# Patient Record
Sex: Female | Born: 1960 | Race: White | Hispanic: No | Marital: Married | Smoking: Never smoker
Health system: Southern US, Community
[De-identification: ages and names within clinical notes are randomized; demographics above are authoritative.]

## PROBLEM LIST (undated history)

## (undated) DIAGNOSIS — E7800 Pure hypercholesterolemia, unspecified: Secondary | ICD-10-CM

## (undated) DIAGNOSIS — H269 Unspecified cataract: Secondary | ICD-10-CM

## (undated) DIAGNOSIS — D219 Benign neoplasm of connective and other soft tissue, unspecified: Secondary | ICD-10-CM

## (undated) DIAGNOSIS — I10 Essential (primary) hypertension: Secondary | ICD-10-CM

## (undated) DIAGNOSIS — T7840XA Allergy, unspecified, initial encounter: Secondary | ICD-10-CM

## (undated) DIAGNOSIS — M1990 Unspecified osteoarthritis, unspecified site: Secondary | ICD-10-CM

## (undated) HISTORY — DX: Essential (primary) hypertension: I10

## (undated) HISTORY — DX: Unspecified osteoarthritis, unspecified site: M19.90

## (undated) HISTORY — PX: BREAST BIOPSY: SHX20

## (undated) HISTORY — DX: Allergy, unspecified, initial encounter: T78.40XA

## (undated) HISTORY — DX: Pure hypercholesterolemia, unspecified: E78.00

## (undated) HISTORY — PX: EYE SURGERY: SHX253

## (undated) HISTORY — DX: Benign neoplasm of connective and other soft tissue, unspecified: D21.9

## (undated) HISTORY — DX: Unspecified cataract: H26.9

## (undated) HISTORY — PX: ENDOMETRIAL ABLATION: SHX621

## (undated) HISTORY — PX: WISDOM TOOTH EXTRACTION: SHX21

## (undated) HISTORY — PX: BREAST EXCISIONAL BIOPSY: SUR124

---

## 2008-09-06 ENCOUNTER — Ambulatory Visit (HOSPITAL_COMMUNITY): Admission: RE | Admit: 2008-09-06 | Discharge: 2008-09-06 | Payer: Self-pay | Admitting: Obstetrics and Gynecology

## 2008-09-06 ENCOUNTER — Encounter (INDEPENDENT_AMBULATORY_CARE_PROVIDER_SITE_OTHER): Payer: Self-pay | Admitting: Obstetrics and Gynecology

## 2008-12-02 ENCOUNTER — Ambulatory Visit (HOSPITAL_COMMUNITY): Admission: RE | Admit: 2008-12-02 | Discharge: 2008-12-02 | Payer: Self-pay | Admitting: Obstetrics and Gynecology

## 2009-12-05 ENCOUNTER — Encounter: Admission: RE | Admit: 2009-12-05 | Discharge: 2009-12-05 | Payer: Self-pay | Admitting: Obstetrics and Gynecology

## 2010-08-15 LAB — CBC
HCT: 37.4 % (ref 36.0–46.0)
Hemoglobin: 12.9 g/dL (ref 12.0–15.0)
MCHC: 34.6 g/dL (ref 30.0–36.0)
MCV: 88.4 fL (ref 78.0–100.0)
Platelets: 255 10*3/uL (ref 150–400)
RDW: 13.9 % (ref 11.5–15.5)
WBC: 5.8 10*3/uL (ref 4.0–10.5)

## 2010-08-15 LAB — HCG, SERUM, QUALITATIVE: Preg, Serum: NEGATIVE

## 2010-09-19 NOTE — Op Note (Signed)
Catherine Yates, Catherine Yates               ACCOUNT NO.:  [id number]   MEDICAL RECORD NO.:  [id number]          PATIENT TYPE:  AMB   LOCATION:  SDC                           FACILITY:  [facility]   PHYSICIAN:  Lenoard Aden, M.D.DATE OF BIRTH:  1961-02-14   DATE OF PROCEDURE:  09/06/2008  DATE OF DISCHARGE:                               OPERATIVE REPORT   PREOPERATIVE DIAGNOSIS:  Menometrorrhagia with submucous fibroid.   POSTOPERATIVE DIAGNOSIS:  Menometrorrhagia with submucous fibroid.   PROCEDURE:  Diagnostic hysteroscopy,dilation and curettage,  resectoscopic myomectomy, NovaSure endometrial ablation.   SURGEON:  Lenoard Aden, MD   ANESTHESIA:  General and local.   ESTIMATED BLOOD LOSS:  Less than 50 mL.   COMPLICATIONS:  None.   DRAINS:  None.   COUNTS:  Correct.   FLUID DEFICIT:  45 mL.   The patient to Recovery in good condition.   BRIEF OPERATIVE NOTE:  After being apprised of the risks of anesthesia,  infection; bleeding; injury to abdominal organs; need for repair;  delayed versus immediate complications to include bowel and bladder  injury, possible inability to cure bleeding.  The patient brought to the  operating where she was administered general anesthetic without  complications, prepped and draped in the usual sterile fashion.  Feet  was placed on Yellofin stirrups, catheterized and the bladder was empty.  After achieving adequate anesthesia, dilute Pitressin solution was  placed at 3 and 9 o'clock at the cervicovaginal junction, 16 mL total.  No intravascular extravasation was noted.  At this time, cervix was  easily dilated up to #31 [facility] dilator.  Hysteroscope placed.  Visualization reveals free definitive submucous fibroids, 2 anteriorly  and 1 large one in the posterior fundal area.  All were resected using  multiple passes, used the right-angle loop down to the level of the  myometrium with good hemostasis and no evidence of uterine perforation.  Afterwards a large, but otherwise normal endometrial cavity was noted.  D&C and using sharp curettage in a four-quadrant method was performed  and good hemostasis was noted.  NovaSure device was placed.  Cavity  width of 4.5 was noted.  Device was placed, seated in the appropriate  fashion.  CO2 test performed was negative, and the procedure was  initiated for a total time of 50 seconds with good ablation of  endometrial cavity.  Revisualized once hysteroscopic placement.  No  evidence of uterine perforation.  Good  hemostasis noted.  All instruments removed.  The patient tolerated the  procedure well and dilute Marcaine solution was placed, 30 mL total with  a dilute paracervical block for postoperative pain relief.  The patient  was sent to the recovery in good condition.      Lenoard Aden, M.D.  Electronically Signed     RJT/MEDQ  D:  09/06/2008  T:  09/06/2008  Job:  [id number]

## 2010-12-04 ENCOUNTER — Other Ambulatory Visit: Payer: Self-pay | Admitting: Obstetrics and Gynecology

## 2010-12-04 DIAGNOSIS — Z1231 Encounter for screening mammogram for malignant neoplasm of breast: Secondary | ICD-10-CM

## 2010-12-12 ENCOUNTER — Ambulatory Visit
Admission: RE | Admit: 2010-12-12 | Discharge: 2010-12-12 | Disposition: A | Discharge status: Home / Self Care | Payer: BC Managed Care – PPO | Source: Ambulatory Visit | Attending: Obstetrics and Gynecology | Admitting: Obstetrics and Gynecology

## 2010-12-12 DIAGNOSIS — Z1231 Encounter for screening mammogram for malignant neoplasm of breast: Secondary | ICD-10-CM

## 2011-11-14 ENCOUNTER — Other Ambulatory Visit: Payer: Self-pay | Admitting: Obstetrics and Gynecology

## 2011-11-14 DIAGNOSIS — Z1231 Encounter for screening mammogram for malignant neoplasm of breast: Secondary | ICD-10-CM

## 2011-12-13 ENCOUNTER — Ambulatory Visit
Admission: RE | Admit: 2011-12-13 | Discharge: 2011-12-13 | Disposition: A | Discharge status: Home / Self Care | Payer: BC Managed Care – PPO | Source: Ambulatory Visit | Attending: Obstetrics and Gynecology | Admitting: Obstetrics and Gynecology

## 2011-12-13 DIAGNOSIS — Z1231 Encounter for screening mammogram for malignant neoplasm of breast: Secondary | ICD-10-CM

## 2012-02-06 ENCOUNTER — Other Ambulatory Visit: Payer: Self-pay | Admitting: Obstetrics and Gynecology

## 2012-02-06 DIAGNOSIS — N951 Menopausal and female climacteric states: Secondary | ICD-10-CM

## 2012-02-06 DIAGNOSIS — Z780 Asymptomatic menopausal state: Secondary | ICD-10-CM

## 2012-02-18 ENCOUNTER — Ambulatory Visit
Admission: RE | Admit: 2012-02-18 | Discharge: 2012-02-18 | Disposition: A | Discharge status: Home / Self Care | Payer: BC Managed Care – PPO | Source: Ambulatory Visit | Attending: Obstetrics and Gynecology | Admitting: Obstetrics and Gynecology

## 2012-02-18 DIAGNOSIS — Z780 Asymptomatic menopausal state: Secondary | ICD-10-CM

## 2012-02-18 DIAGNOSIS — N951 Menopausal and female climacteric states: Secondary | ICD-10-CM

## 2012-12-11 ENCOUNTER — Other Ambulatory Visit: Payer: Self-pay

## 2012-12-11 DIAGNOSIS — Z1231 Encounter for screening mammogram for malignant neoplasm of breast: Secondary | ICD-10-CM

## 2012-12-31 ENCOUNTER — Ambulatory Visit
Admission: RE | Admit: 2012-12-31 | Discharge: 2012-12-31 | Disposition: A | Discharge status: Home / Self Care | Payer: BC Managed Care – PPO | Source: Ambulatory Visit

## 2012-12-31 DIAGNOSIS — Z1231 Encounter for screening mammogram for malignant neoplasm of breast: Secondary | ICD-10-CM

## 2013-12-09 ENCOUNTER — Other Ambulatory Visit: Payer: Self-pay

## 2013-12-09 DIAGNOSIS — Z1231 Encounter for screening mammogram for malignant neoplasm of breast: Secondary | ICD-10-CM

## 2014-01-01 ENCOUNTER — Encounter (INDEPENDENT_AMBULATORY_CARE_PROVIDER_SITE_OTHER): Payer: Self-pay

## 2014-01-01 ENCOUNTER — Ambulatory Visit
Admission: RE | Admit: 2014-01-01 | Discharge: 2014-01-01 | Disposition: A | Discharge status: Home / Self Care | Payer: BC Managed Care – PPO | Source: Ambulatory Visit

## 2014-01-01 DIAGNOSIS — Z1231 Encounter for screening mammogram for malignant neoplasm of breast: Secondary | ICD-10-CM

## 2014-12-14 ENCOUNTER — Other Ambulatory Visit: Payer: Self-pay

## 2014-12-14 DIAGNOSIS — Z1231 Encounter for screening mammogram for malignant neoplasm of breast: Secondary | ICD-10-CM

## 2015-01-21 ENCOUNTER — Ambulatory Visit
Admission: RE | Admit: 2015-01-21 | Discharge: 2015-01-21 | Disposition: A | Discharge status: Home / Self Care | Payer: BLUE CROSS/BLUE SHIELD | Source: Ambulatory Visit

## 2015-01-21 DIAGNOSIS — Z1231 Encounter for screening mammogram for malignant neoplasm of breast: Secondary | ICD-10-CM

## 2015-12-21 ENCOUNTER — Other Ambulatory Visit: Payer: Self-pay | Admitting: Obstetrics and Gynecology

## 2015-12-21 DIAGNOSIS — Z1231 Encounter for screening mammogram for malignant neoplasm of breast: Secondary | ICD-10-CM

## 2016-01-23 ENCOUNTER — Ambulatory Visit
Admission: RE | Admit: 2016-01-23 | Discharge: 2016-01-23 | Disposition: A | Discharge status: Home / Self Care | Payer: BLUE CROSS/BLUE SHIELD | Source: Ambulatory Visit | Attending: Obstetrics and Gynecology | Admitting: Obstetrics and Gynecology

## 2016-01-23 DIAGNOSIS — Z1231 Encounter for screening mammogram for malignant neoplasm of breast: Secondary | ICD-10-CM

## 2016-05-07 HISTORY — PX: COLONOSCOPY: SHX174

## 2016-05-21 DIAGNOSIS — K648 Other hemorrhoids: Secondary | ICD-10-CM | POA: Diagnosis not present

## 2016-05-21 DIAGNOSIS — K5730 Diverticulosis of large intestine without perforation or abscess without bleeding: Secondary | ICD-10-CM | POA: Diagnosis not present

## 2016-05-21 DIAGNOSIS — Z86010 Personal history of colonic polyps: Secondary | ICD-10-CM | POA: Diagnosis not present

## 2016-06-15 DIAGNOSIS — M791 Myalgia: Secondary | ICD-10-CM | POA: Diagnosis not present

## 2016-06-15 DIAGNOSIS — M7611 Psoas tendinitis, right hip: Secondary | ICD-10-CM | POA: Diagnosis not present

## 2016-06-15 DIAGNOSIS — M9902 Segmental and somatic dysfunction of thoracic region: Secondary | ICD-10-CM | POA: Diagnosis not present

## 2016-06-15 DIAGNOSIS — M7701 Medial epicondylitis, right elbow: Secondary | ICD-10-CM | POA: Diagnosis not present

## 2016-06-20 DIAGNOSIS — M7701 Medial epicondylitis, right elbow: Secondary | ICD-10-CM | POA: Diagnosis not present

## 2016-06-20 DIAGNOSIS — M791 Myalgia: Secondary | ICD-10-CM | POA: Diagnosis not present

## 2016-06-20 DIAGNOSIS — M7611 Psoas tendinitis, right hip: Secondary | ICD-10-CM | POA: Diagnosis not present

## 2016-06-20 DIAGNOSIS — M9902 Segmental and somatic dysfunction of thoracic region: Secondary | ICD-10-CM | POA: Diagnosis not present

## 2016-06-22 DIAGNOSIS — M7611 Psoas tendinitis, right hip: Secondary | ICD-10-CM | POA: Diagnosis not present

## 2016-06-22 DIAGNOSIS — M7701 Medial epicondylitis, right elbow: Secondary | ICD-10-CM | POA: Diagnosis not present

## 2016-06-22 DIAGNOSIS — M791 Myalgia: Secondary | ICD-10-CM | POA: Diagnosis not present

## 2016-06-22 DIAGNOSIS — M9902 Segmental and somatic dysfunction of thoracic region: Secondary | ICD-10-CM | POA: Diagnosis not present

## 2016-06-27 DIAGNOSIS — M9902 Segmental and somatic dysfunction of thoracic region: Secondary | ICD-10-CM | POA: Diagnosis not present

## 2016-06-27 DIAGNOSIS — M791 Myalgia: Secondary | ICD-10-CM | POA: Diagnosis not present

## 2016-06-27 DIAGNOSIS — M7701 Medial epicondylitis, right elbow: Secondary | ICD-10-CM | POA: Diagnosis not present

## 2016-06-27 DIAGNOSIS — M7611 Psoas tendinitis, right hip: Secondary | ICD-10-CM | POA: Diagnosis not present

## 2016-07-04 DIAGNOSIS — M9902 Segmental and somatic dysfunction of thoracic region: Secondary | ICD-10-CM | POA: Diagnosis not present

## 2016-07-04 DIAGNOSIS — M791 Myalgia: Secondary | ICD-10-CM | POA: Diagnosis not present

## 2016-07-04 DIAGNOSIS — M7611 Psoas tendinitis, right hip: Secondary | ICD-10-CM | POA: Diagnosis not present

## 2016-07-04 DIAGNOSIS — M7701 Medial epicondylitis, right elbow: Secondary | ICD-10-CM | POA: Diagnosis not present

## 2016-07-18 DIAGNOSIS — M7701 Medial epicondylitis, right elbow: Secondary | ICD-10-CM | POA: Diagnosis not present

## 2016-07-18 DIAGNOSIS — M791 Myalgia: Secondary | ICD-10-CM | POA: Diagnosis not present

## 2016-07-18 DIAGNOSIS — M7611 Psoas tendinitis, right hip: Secondary | ICD-10-CM | POA: Diagnosis not present

## 2016-07-18 DIAGNOSIS — M9902 Segmental and somatic dysfunction of thoracic region: Secondary | ICD-10-CM | POA: Diagnosis not present

## 2016-07-25 DIAGNOSIS — M7611 Psoas tendinitis, right hip: Secondary | ICD-10-CM | POA: Diagnosis not present

## 2016-07-25 DIAGNOSIS — M9902 Segmental and somatic dysfunction of thoracic region: Secondary | ICD-10-CM | POA: Diagnosis not present

## 2016-07-25 DIAGNOSIS — M7701 Medial epicondylitis, right elbow: Secondary | ICD-10-CM | POA: Diagnosis not present

## 2016-07-25 DIAGNOSIS — M791 Myalgia: Secondary | ICD-10-CM | POA: Diagnosis not present

## 2016-08-08 DIAGNOSIS — M9902 Segmental and somatic dysfunction of thoracic region: Secondary | ICD-10-CM | POA: Diagnosis not present

## 2016-08-08 DIAGNOSIS — M7701 Medial epicondylitis, right elbow: Secondary | ICD-10-CM | POA: Diagnosis not present

## 2016-08-08 DIAGNOSIS — M791 Myalgia: Secondary | ICD-10-CM | POA: Diagnosis not present

## 2016-08-08 DIAGNOSIS — M7611 Psoas tendinitis, right hip: Secondary | ICD-10-CM | POA: Diagnosis not present

## 2016-08-29 DIAGNOSIS — M7701 Medial epicondylitis, right elbow: Secondary | ICD-10-CM | POA: Diagnosis not present

## 2016-08-29 DIAGNOSIS — M7611 Psoas tendinitis, right hip: Secondary | ICD-10-CM | POA: Diagnosis not present

## 2016-08-29 DIAGNOSIS — M9902 Segmental and somatic dysfunction of thoracic region: Secondary | ICD-10-CM | POA: Diagnosis not present

## 2016-08-29 DIAGNOSIS — M791 Myalgia: Secondary | ICD-10-CM | POA: Diagnosis not present

## 2016-09-12 DIAGNOSIS — M7611 Psoas tendinitis, right hip: Secondary | ICD-10-CM | POA: Diagnosis not present

## 2016-09-12 DIAGNOSIS — M791 Myalgia: Secondary | ICD-10-CM | POA: Diagnosis not present

## 2016-09-12 DIAGNOSIS — M7701 Medial epicondylitis, right elbow: Secondary | ICD-10-CM | POA: Diagnosis not present

## 2016-09-12 DIAGNOSIS — M9902 Segmental and somatic dysfunction of thoracic region: Secondary | ICD-10-CM | POA: Diagnosis not present

## 2016-10-03 DIAGNOSIS — M7701 Medial epicondylitis, right elbow: Secondary | ICD-10-CM | POA: Diagnosis not present

## 2016-10-03 DIAGNOSIS — M7611 Psoas tendinitis, right hip: Secondary | ICD-10-CM | POA: Diagnosis not present

## 2016-10-03 DIAGNOSIS — M791 Myalgia: Secondary | ICD-10-CM | POA: Diagnosis not present

## 2016-10-03 DIAGNOSIS — M9902 Segmental and somatic dysfunction of thoracic region: Secondary | ICD-10-CM | POA: Diagnosis not present

## 2016-10-12 DIAGNOSIS — M7611 Psoas tendinitis, right hip: Secondary | ICD-10-CM | POA: Diagnosis not present

## 2016-10-12 DIAGNOSIS — M7701 Medial epicondylitis, right elbow: Secondary | ICD-10-CM | POA: Diagnosis not present

## 2016-10-12 DIAGNOSIS — M791 Myalgia: Secondary | ICD-10-CM | POA: Diagnosis not present

## 2016-10-12 DIAGNOSIS — M9902 Segmental and somatic dysfunction of thoracic region: Secondary | ICD-10-CM | POA: Diagnosis not present

## 2016-10-19 DIAGNOSIS — S61213A Laceration without foreign body of left middle finger without damage to nail, initial encounter: Secondary | ICD-10-CM | POA: Diagnosis not present

## 2016-10-24 DIAGNOSIS — M9902 Segmental and somatic dysfunction of thoracic region: Secondary | ICD-10-CM | POA: Diagnosis not present

## 2016-10-24 DIAGNOSIS — M7701 Medial epicondylitis, right elbow: Secondary | ICD-10-CM | POA: Diagnosis not present

## 2016-10-24 DIAGNOSIS — M791 Myalgia: Secondary | ICD-10-CM | POA: Diagnosis not present

## 2016-10-24 DIAGNOSIS — M7611 Psoas tendinitis, right hip: Secondary | ICD-10-CM | POA: Diagnosis not present

## 2016-12-14 ENCOUNTER — Other Ambulatory Visit: Payer: Self-pay | Admitting: Obstetrics and Gynecology

## 2016-12-14 DIAGNOSIS — Z1231 Encounter for screening mammogram for malignant neoplasm of breast: Secondary | ICD-10-CM

## 2017-01-24 ENCOUNTER — Ambulatory Visit
Admission: RE | Admit: 2017-01-24 | Discharge: 2017-01-24 | Disposition: A | Discharge status: Home / Self Care | Payer: BLUE CROSS/BLUE SHIELD | Source: Ambulatory Visit | Attending: Obstetrics and Gynecology | Admitting: Obstetrics and Gynecology

## 2017-01-24 DIAGNOSIS — Z1231 Encounter for screening mammogram for malignant neoplasm of breast: Secondary | ICD-10-CM | POA: Diagnosis not present

## 2017-01-25 DIAGNOSIS — I10 Essential (primary) hypertension: Secondary | ICD-10-CM | POA: Diagnosis not present

## 2017-01-25 DIAGNOSIS — Z23 Encounter for immunization: Secondary | ICD-10-CM | POA: Diagnosis not present

## 2017-01-25 DIAGNOSIS — E785 Hyperlipidemia, unspecified: Secondary | ICD-10-CM | POA: Diagnosis not present

## 2017-01-25 DIAGNOSIS — Z0000 Encounter for general adult medical examination without abnormal findings: Secondary | ICD-10-CM | POA: Diagnosis not present

## 2017-02-20 DIAGNOSIS — Z01419 Encounter for gynecological examination (general) (routine) without abnormal findings: Secondary | ICD-10-CM | POA: Diagnosis not present

## 2017-02-20 DIAGNOSIS — Z6831 Body mass index (BMI) 31.0-31.9, adult: Secondary | ICD-10-CM | POA: Diagnosis not present

## 2017-05-24 DIAGNOSIS — D1801 Hemangioma of skin and subcutaneous tissue: Secondary | ICD-10-CM | POA: Diagnosis not present

## 2017-05-24 DIAGNOSIS — D229 Melanocytic nevi, unspecified: Secondary | ICD-10-CM | POA: Diagnosis not present

## 2017-05-24 DIAGNOSIS — L821 Other seborrheic keratosis: Secondary | ICD-10-CM | POA: Diagnosis not present

## 2017-05-24 DIAGNOSIS — D2330 Other benign neoplasm of skin of unspecified part of face: Secondary | ICD-10-CM | POA: Diagnosis not present

## 2017-10-11 DIAGNOSIS — L858 Other specified epidermal thickening: Secondary | ICD-10-CM | POA: Diagnosis not present

## 2017-10-11 DIAGNOSIS — D489 Neoplasm of uncertain behavior, unspecified: Secondary | ICD-10-CM | POA: Diagnosis not present

## 2017-10-11 DIAGNOSIS — D2310 Other benign neoplasm of skin of unspecified eyelid, including canthus: Secondary | ICD-10-CM | POA: Diagnosis not present

## 2017-12-16 ENCOUNTER — Other Ambulatory Visit: Payer: Self-pay | Admitting: Obstetrics and Gynecology

## 2017-12-16 DIAGNOSIS — Z1231 Encounter for screening mammogram for malignant neoplasm of breast: Secondary | ICD-10-CM

## 2018-01-27 ENCOUNTER — Ambulatory Visit
Admission: RE | Admit: 2018-01-27 | Discharge: 2018-01-27 | Disposition: A | Discharge status: Home / Self Care | Payer: BLUE CROSS/BLUE SHIELD | Source: Ambulatory Visit | Attending: Obstetrics and Gynecology | Admitting: Obstetrics and Gynecology

## 2018-01-27 DIAGNOSIS — Z1231 Encounter for screening mammogram for malignant neoplasm of breast: Secondary | ICD-10-CM | POA: Diagnosis not present

## 2018-02-07 DIAGNOSIS — E785 Hyperlipidemia, unspecified: Secondary | ICD-10-CM | POA: Diagnosis not present

## 2018-02-07 DIAGNOSIS — Z23 Encounter for immunization: Secondary | ICD-10-CM | POA: Diagnosis not present

## 2018-02-07 DIAGNOSIS — I10 Essential (primary) hypertension: Secondary | ICD-10-CM | POA: Diagnosis not present

## 2018-02-07 DIAGNOSIS — Z0000 Encounter for general adult medical examination without abnormal findings: Secondary | ICD-10-CM | POA: Diagnosis not present

## 2018-03-03 DIAGNOSIS — W57XXXA Bitten or stung by nonvenomous insect and other nonvenomous arthropods, initial encounter: Secondary | ICD-10-CM | POA: Diagnosis not present

## 2018-03-03 DIAGNOSIS — L03012 Cellulitis of left finger: Secondary | ICD-10-CM | POA: Diagnosis not present

## 2018-07-07 DIAGNOSIS — H9312 Tinnitus, left ear: Secondary | ICD-10-CM | POA: Diagnosis not present

## 2018-07-07 DIAGNOSIS — H90A22 Sensorineural hearing loss, unilateral, left ear, with restricted hearing on the contralateral side: Secondary | ICD-10-CM | POA: Diagnosis not present

## 2018-07-07 DIAGNOSIS — H8111 Benign paroxysmal vertigo, right ear: Secondary | ICD-10-CM | POA: Diagnosis not present

## 2018-07-07 DIAGNOSIS — H903 Sensorineural hearing loss, bilateral: Secondary | ICD-10-CM | POA: Diagnosis not present

## 2018-07-07 DIAGNOSIS — H90A31 Mixed conductive and sensorineural hearing loss, unilateral, right ear with restricted hearing on the contralateral side: Secondary | ICD-10-CM | POA: Diagnosis not present

## 2018-12-19 ENCOUNTER — Other Ambulatory Visit: Payer: Self-pay | Admitting: Obstetrics and Gynecology

## 2018-12-19 DIAGNOSIS — Z1231 Encounter for screening mammogram for malignant neoplasm of breast: Secondary | ICD-10-CM

## 2019-01-30 ENCOUNTER — Ambulatory Visit
Admission: RE | Admit: 2019-01-30 | Discharge: 2019-01-30 | Disposition: A | Discharge status: Home / Self Care | Payer: BC Managed Care – PPO | Source: Ambulatory Visit | Attending: Obstetrics and Gynecology | Admitting: Obstetrics and Gynecology

## 2019-01-30 ENCOUNTER — Other Ambulatory Visit: Payer: Self-pay

## 2019-01-30 DIAGNOSIS — Z1231 Encounter for screening mammogram for malignant neoplasm of breast: Secondary | ICD-10-CM | POA: Diagnosis not present

## 2019-01-30 DIAGNOSIS — Z23 Encounter for immunization: Secondary | ICD-10-CM | POA: Diagnosis not present

## 2019-03-20 DIAGNOSIS — E785 Hyperlipidemia, unspecified: Secondary | ICD-10-CM | POA: Diagnosis not present

## 2019-03-20 DIAGNOSIS — Z0000 Encounter for general adult medical examination without abnormal findings: Secondary | ICD-10-CM | POA: Diagnosis not present

## 2019-03-20 DIAGNOSIS — I10 Essential (primary) hypertension: Secondary | ICD-10-CM | POA: Diagnosis not present

## 2019-05-04 DIAGNOSIS — H43812 Vitreous degeneration, left eye: Secondary | ICD-10-CM | POA: Diagnosis not present

## 2019-06-11 DIAGNOSIS — H43812 Vitreous degeneration, left eye: Secondary | ICD-10-CM | POA: Diagnosis not present

## 2019-07-08 DIAGNOSIS — H903 Sensorineural hearing loss, bilateral: Secondary | ICD-10-CM | POA: Diagnosis not present

## 2019-07-08 DIAGNOSIS — H90A31 Mixed conductive and sensorineural hearing loss, unilateral, right ear with restricted hearing on the contralateral side: Secondary | ICD-10-CM | POA: Diagnosis not present

## 2019-07-08 DIAGNOSIS — H6981 Other specified disorders of Eustachian tube, right ear: Secondary | ICD-10-CM | POA: Diagnosis not present

## 2019-07-08 DIAGNOSIS — J309 Allergic rhinitis, unspecified: Secondary | ICD-10-CM | POA: Diagnosis not present

## 2020-01-21 ENCOUNTER — Other Ambulatory Visit: Payer: Self-pay | Admitting: Family Medicine

## 2020-01-21 DIAGNOSIS — Z1231 Encounter for screening mammogram for malignant neoplasm of breast: Secondary | ICD-10-CM

## 2020-01-22 DIAGNOSIS — L578 Other skin changes due to chronic exposure to nonionizing radiation: Secondary | ICD-10-CM | POA: Diagnosis not present

## 2020-01-22 DIAGNOSIS — D1801 Hemangioma of skin and subcutaneous tissue: Secondary | ICD-10-CM | POA: Diagnosis not present

## 2020-01-22 DIAGNOSIS — D225 Melanocytic nevi of trunk: Secondary | ICD-10-CM | POA: Diagnosis not present

## 2020-01-22 DIAGNOSIS — L719 Rosacea, unspecified: Secondary | ICD-10-CM | POA: Diagnosis not present

## 2020-02-09 ENCOUNTER — Other Ambulatory Visit: Payer: Self-pay

## 2020-02-09 ENCOUNTER — Ambulatory Visit
Admission: RE | Admit: 2020-02-09 | Discharge: 2020-02-09 | Disposition: A | Discharge status: Home / Self Care | Payer: BC Managed Care – PPO | Source: Ambulatory Visit | Attending: Family Medicine | Admitting: Family Medicine

## 2020-02-09 DIAGNOSIS — Z1231 Encounter for screening mammogram for malignant neoplasm of breast: Secondary | ICD-10-CM

## 2020-03-21 DIAGNOSIS — Z0000 Encounter for general adult medical examination without abnormal findings: Secondary | ICD-10-CM | POA: Diagnosis not present

## 2020-03-21 DIAGNOSIS — I10 Essential (primary) hypertension: Secondary | ICD-10-CM | POA: Diagnosis not present

## 2020-03-21 DIAGNOSIS — E785 Hyperlipidemia, unspecified: Secondary | ICD-10-CM | POA: Diagnosis not present

## 2020-06-08 ENCOUNTER — Other Ambulatory Visit: Payer: Self-pay

## 2020-06-08 ENCOUNTER — Encounter: Payer: Self-pay | Admitting: Obstetrics and Gynecology

## 2020-06-08 ENCOUNTER — Other Ambulatory Visit (HOSPITAL_COMMUNITY)
Admission: RE | Admit: 2020-06-08 | Discharge: 2020-06-08 | Disposition: A | Discharge status: Home / Self Care | Payer: BC Managed Care – PPO | Source: Ambulatory Visit | Attending: Obstetrics and Gynecology | Admitting: Obstetrics and Gynecology

## 2020-06-08 ENCOUNTER — Ambulatory Visit: Payer: BC Managed Care – PPO | Admitting: Obstetrics and Gynecology

## 2020-06-08 VITALS — BP 142/76 | HR 72 | Ht 65.5 in | Wt 193.0 lb

## 2020-06-08 DIAGNOSIS — Z0000 Encounter for general adult medical examination without abnormal findings: Secondary | ICD-10-CM

## 2020-06-08 DIAGNOSIS — Z124 Encounter for screening for malignant neoplasm of cervix: Secondary | ICD-10-CM

## 2020-06-08 DIAGNOSIS — Z803 Family history of malignant neoplasm of breast: Secondary | ICD-10-CM

## 2020-06-08 DIAGNOSIS — E669 Obesity, unspecified: Secondary | ICD-10-CM | POA: Insufficient documentation

## 2020-06-08 DIAGNOSIS — Z1211 Encounter for screening for malignant neoplasm of colon: Secondary | ICD-10-CM | POA: Insufficient documentation

## 2020-06-08 DIAGNOSIS — Z01419 Encounter for gynecological examination (general) (routine) without abnormal findings: Secondary | ICD-10-CM

## 2020-06-08 DIAGNOSIS — Z8371 Family history of colonic polyps: Secondary | ICD-10-CM | POA: Insufficient documentation

## 2020-06-08 HISTORY — DX: Encounter for general adult medical examination without abnormal findings: Z00.00

## 2020-06-08 NOTE — Patient Instructions (Signed)

## 2020-06-08 NOTE — Progress Notes (Signed)
60 y.o. G0P0000 Married White or Caucasian Not Hispanic or Latino female here as a new patient for an annual exam.   No vaginal bleeding. She c/o deep dyspareunia, not positional. Going on for years.   Husband is having a simple prostatectomy in 2 weeks.   Occasional constipation. No urinary c/o.     No LMP recorded (lmp unknown). Patient is postmenopausal.          Sexually active: Yes.    The current method of family planning is post menopausal status.    Exercising: Yes.    walking and tennis Smoker:  no  Health Maintenance: Pap:  Few years ago per patient normal  History of abnormal Pap:  no MMG:  02/09/20 BIRADS 1 negative/density c BMD:   02/18/12 Normal Colonoscopy: 5 years ago per patient polyp removed TDaP:  unsure Gardasil: n/a   reports that she has never smoked. She has never used smokeless tobacco. She reports current alcohol use. She reports that she does not use drugs. She drinks less than 7 drinks a week. 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 Husband is retired.   Past Medical History:  Diagnosis Date  . Fibroid   . Hypertension   Not sure if she has fibroids.   Past Surgical History:  Procedure Laterality Date  . BREAST BIOPSY Left    approx 2009  . ENDOMETRIAL ABLATION      Current Outpatient Medications  Medication Sig Dispense Refill  . Azelaic Acid 15 % cream Apply to affected areas daily.    . Azelastine HCl 0.15 % SOLN Place 2 sprays into both nostrils daily.    Marland Kitchen levocetirizine (XYZAL) 5 MG tablet Take 5 mg by mouth daily.    Marland Kitchen lisinopril (ZESTRIL) 10 MG tablet Take 10 mg by mouth daily.    Marland Kitchen tretinoin (RETIN-A) 0.025 % cream Apply pea-sized amount to face and neck nightly.    . tretinoin microspheres (RETIN-A MICRO) 0.04 % gel Apply topically.     No current facility-administered medications for this visit.    Family History  Problem Relation Age of Onset  . Breast cancer Mother   . Hypertension  Mother   . Diabetes Mother   . Congestive Heart Failure Mother   . Renal Disease Mother   . Breast cancer Maternal Aunt   . Breast cancer Sister   . Hypertension Sister   . Hyperlipidemia Sister   . Heart Problems Father   . Cancer Father   . Diabetes Father   . Hypertension Brother   . Diabetes Brother   . Hyperlipidemia Brother   . Diabetes Maternal Grandmother   . Stroke Maternal Grandfather   . Stomach cancer Paternal Grandfather   . Alcoholism Paternal Grandfather   . Hypertension Sister   . Hyperlipidemia Sister   . Hypertension Sister   . Hyperlipidemia Sister   Mom, [place] both with h/o breast cancer, they were both in their 76's. Sister has hyperplasia of her breast, no cancer.   Review of Systems  Constitutional: Negative.   HENT: Negative.   Eyes: Negative.   Respiratory: Negative.   Cardiovascular: Negative.   Gastrointestinal: Negative.   Endocrine: Negative.   Genitourinary: Negative.   Musculoskeletal: Negative.   Skin: Negative.   Allergic/Immunologic: Negative.   Neurological: Negative.   Hematological: Negative.   Psychiatric/Behavioral: Negative.     Exam:   BP (!) 142/76 (BP Location: Left Arm, Patient Position: Sitting, Cuff Size: Normal)  Pulse 72   Ht 5' 5.5" (1.664 m)   Wt 193 lb (87.5 kg)   LMP  (LMP Unknown)   BMI 31.63 kg/m   Weight change: @WEIGHTCHANGE @ Height:   Height: 5' 5.5" (166.4 cm)  Ht Readings from Last 3 Encounters:  06/08/20 5' 5.5" (1.664 m)    General appearance: alert, cooperative and appears stated age Head: Normocephalic, without obvious abnormality, atraumatic Neck: no adenopathy, supple, symmetrical, trachea midline and thyroid normal to inspection and palpation Lungs: clear to auscultation bilaterally Cardiovascular: regular rate and rhythm Breasts: normal appearance, no masses or tenderness Abdomen: soft, non-tender; non distended,  no masses,  no organomegaly Extremities: extremities normal, atraumatic, no  cyanosis or edema Skin: Skin color, texture, turgor normal. No rashes or lesions Lymph nodes: Cervical, supraclavicular, and axillary nodes normal. No abnormal inguinal nodes palpated Neurologic: Grossly normal   Pelvic: External genitalia:  no lesions              Urethra:  normal appearing urethra with no masses, tenderness or lesions              Bartholins and Skenes: normal                 Vagina: normal appearing vagina with normal color and discharge, no lesions              Cervix: no lesions               Bimanual Exam:  Uterus:  no masses or tenderness              Adnexa: no mass, fullness, tenderness               Rectovaginal: Confirms               Anus:  normal sphincter tone, no lesions  Terence Lux chaperoned for the exam.  1. Well woman exam Discussed breast self exam  Discussed calcium and vit D intake Labs with primary Mammogram and colonoscopy are UTD  2. Screening for cervical cancer  - Cytology - PAP  3. Family history of breast cancer Mom and MAunt, both in their 87's.

## 2020-06-09 LAB — CYTOLOGY - PAP
Comment: NEGATIVE
Diagnosis: NEGATIVE
High risk HPV: NEGATIVE

## 2020-08-05 HISTORY — PX: EYE SURGERY: SHX253

## 2020-08-16 DIAGNOSIS — H43392 Other vitreous opacities, left eye: Secondary | ICD-10-CM | POA: Diagnosis not present

## 2020-08-16 DIAGNOSIS — H35341 Macular cyst, hole, or pseudohole, right eye: Secondary | ICD-10-CM | POA: Diagnosis not present

## 2020-08-16 DIAGNOSIS — H43813 Vitreous degeneration, bilateral: Secondary | ICD-10-CM | POA: Diagnosis not present

## 2020-08-29 DIAGNOSIS — H35341 Macular cyst, hole, or pseudohole, right eye: Secondary | ICD-10-CM | POA: Diagnosis not present

## 2020-08-30 DIAGNOSIS — H35341 Macular cyst, hole, or pseudohole, right eye: Secondary | ICD-10-CM | POA: Diagnosis not present

## 2020-09-07 DIAGNOSIS — H35341 Macular cyst, hole, or pseudohole, right eye: Secondary | ICD-10-CM | POA: Diagnosis not present

## 2020-09-07 DIAGNOSIS — H43812 Vitreous degeneration, left eye: Secondary | ICD-10-CM | POA: Diagnosis not present

## 2020-09-30 DIAGNOSIS — H43812 Vitreous degeneration, left eye: Secondary | ICD-10-CM | POA: Diagnosis not present

## 2020-09-30 DIAGNOSIS — H35341 Macular cyst, hole, or pseudohole, right eye: Secondary | ICD-10-CM | POA: Diagnosis not present

## 2020-12-23 DIAGNOSIS — H2513 Age-related nuclear cataract, bilateral: Secondary | ICD-10-CM | POA: Diagnosis not present

## 2020-12-23 DIAGNOSIS — H3581 Retinal edema: Secondary | ICD-10-CM | POA: Diagnosis not present

## 2020-12-23 DIAGNOSIS — H43392 Other vitreous opacities, left eye: Secondary | ICD-10-CM | POA: Diagnosis not present

## 2020-12-23 DIAGNOSIS — H43812 Vitreous degeneration, left eye: Secondary | ICD-10-CM | POA: Diagnosis not present

## 2021-01-06 ENCOUNTER — Other Ambulatory Visit: Payer: Self-pay | Admitting: Obstetrics and Gynecology

## 2021-01-06 ENCOUNTER — Other Ambulatory Visit: Payer: Self-pay | Admitting: *Deleted

## 2021-01-06 DIAGNOSIS — Z1231 Encounter for screening mammogram for malignant neoplasm of breast: Secondary | ICD-10-CM

## 2021-01-23 DIAGNOSIS — L821 Other seborrheic keratosis: Secondary | ICD-10-CM | POA: Diagnosis not present

## 2021-01-23 DIAGNOSIS — L578 Other skin changes due to chronic exposure to nonionizing radiation: Secondary | ICD-10-CM | POA: Diagnosis not present

## 2021-01-23 DIAGNOSIS — D1801 Hemangioma of skin and subcutaneous tissue: Secondary | ICD-10-CM | POA: Diagnosis not present

## 2021-01-23 DIAGNOSIS — L570 Actinic keratosis: Secondary | ICD-10-CM | POA: Diagnosis not present

## 2021-01-23 DIAGNOSIS — L814 Other melanin hyperpigmentation: Secondary | ICD-10-CM | POA: Diagnosis not present

## 2021-01-25 DIAGNOSIS — H903 Sensorineural hearing loss, bilateral: Secondary | ICD-10-CM | POA: Diagnosis not present

## 2021-01-25 DIAGNOSIS — H6980 Other specified disorders of Eustachian tube, unspecified ear: Secondary | ICD-10-CM | POA: Diagnosis not present

## 2021-02-16 ENCOUNTER — Other Ambulatory Visit: Payer: Self-pay

## 2021-02-16 ENCOUNTER — Ambulatory Visit
Admission: RE | Admit: 2021-02-16 | Discharge: 2021-02-16 | Disposition: A | Discharge status: Home / Self Care | Payer: BC Managed Care – PPO | Source: Ambulatory Visit | Attending: Obstetrics and Gynecology | Admitting: Obstetrics and Gynecology

## 2021-02-16 DIAGNOSIS — Z1231 Encounter for screening mammogram for malignant neoplasm of breast: Secondary | ICD-10-CM

## 2021-03-07 ENCOUNTER — Other Ambulatory Visit: Payer: Self-pay

## 2021-03-07 ENCOUNTER — Ambulatory Visit: Payer: BC Managed Care – PPO | Admitting: Physician Assistant

## 2021-03-07 ENCOUNTER — Ambulatory Visit (INDEPENDENT_AMBULATORY_CARE_PROVIDER_SITE_OTHER)
Admission: RE | Admit: 2021-03-07 | Discharge: 2021-03-07 | Disposition: A | Discharge status: Home / Self Care | Payer: BC Managed Care – PPO | Source: Ambulatory Visit | Attending: Physician Assistant | Admitting: Physician Assistant

## 2021-03-07 VITALS — BP 180/76 | HR 56 | Temp 98.2°F | Ht 65.5 in | Wt 199.2 lb

## 2021-03-07 DIAGNOSIS — Z131 Encounter for screening for diabetes mellitus: Secondary | ICD-10-CM | POA: Diagnosis not present

## 2021-03-07 DIAGNOSIS — Z0000 Encounter for general adult medical examination without abnormal findings: Secondary | ICD-10-CM | POA: Diagnosis not present

## 2021-03-07 DIAGNOSIS — G8929 Other chronic pain: Secondary | ICD-10-CM

## 2021-03-07 DIAGNOSIS — Z780 Asymptomatic menopausal state: Secondary | ICD-10-CM

## 2021-03-07 DIAGNOSIS — I10 Essential (primary) hypertension: Secondary | ICD-10-CM | POA: Diagnosis not present

## 2021-03-07 DIAGNOSIS — Z8371 Family history of colonic polyps: Secondary | ICD-10-CM

## 2021-03-07 DIAGNOSIS — M25551 Pain in right hip: Secondary | ICD-10-CM

## 2021-03-07 DIAGNOSIS — Z1211 Encounter for screening for malignant neoplasm of colon: Secondary | ICD-10-CM | POA: Diagnosis not present

## 2021-03-07 DIAGNOSIS — Z13220 Encounter for screening for lipoid disorders: Secondary | ICD-10-CM

## 2021-03-07 NOTE — Patient Instructions (Addendum)
Great to meet you today!   I have placed referrals for colonoscopy and bone density scan.  Please schedule to have your fasting labs checked.   Please speak with your pharmacy about getting your vaccines updated.   Referral sent for PT. Go to [facility] office for XRAY of hip - see attached.

## 2021-03-07 NOTE — Progress Notes (Signed)
Subjective:    Patient ID: Catherine Yates, female    DOB: 07/01/1960, 60 y.o.   MRN: [id number]  Chief Complaint  Patient presents with   Annual Exam    HPI  Catherine Yates is in today to establish care and receive a comprehensive physical exam.  Acute concerns:  Right Hip Pain  Catherine Yates has been experiencing intermittent right hip pain that has been onset for a couple of years. Catherine Yates has noticed the pain mostly when she tries to move after being in one position for a extended amount of time. She recalls breaking her right ankle a couple of years ago prior to experiencing the hip pain and believes these to be related to each other. Currently she is interested in participating in PT to improve sx and a referral to osteopathic medicine to rule out severe arthritis.   Chronic Concerns:  Hypertension  HTN Currently compliant with lisinopril 10 mg with no adverse effects. Patient denies chest pain, SOB, blurred vision, dizziness, or unusual headaches.  Denies excessive caffeine intake, stimulant usage, excessive alcohol intake, or increase in salt consumption. She did have slight LE swelling but believed that to be due to recent alcohol intake followed by increased water intake. She is managing well. She has hx White Coat Syndrome.  BP Readings from Last 3 Encounters:  03/07/21 (!) 180/76  06/08/20 (!) 142/76    Health maintenance: Lifestyle/ exercise: Tries to move around as much as possible and plays tennis regularly.  Nutrition: Tries to make healthier dietary choices Mental health: Stable Caffeine: 2 cups of coffee every morning Sleep: Normal Ophthalmology- UTD Dentistry- UTD Substance use: None  Immunizations: Covid- Last completed 03/01/21 ([place]- 5 doses) Tdap- Not completed Influenza Vaccine- Last completed 02/10/21 Colonoscopy: Last completed 5 years ago Pap: Last completed 06/08/20 Mammogram: Last completed 02/16/21 DEXA: Last Completed 10 years ago;  unremarkable  Past Medical History:  Diagnosis Date   Fibroid    Hypertension     Past Surgical History:  Procedure Laterality Date   BREAST BIOPSY Left    approx 2009   ENDOMETRIAL ABLATION      Family History  Problem Relation Age of Onset   Breast cancer Mother    Hypertension Mother    Diabetes Mother    Congestive Heart Failure Mother    Renal Disease Mother    Breast cancer Maternal Aunt    Hypertension Sister    Hyperlipidemia Sister    Other Sister        breast hyperplasia   Heart Problems Father    Cancer Father    Diabetes Father    Hypertension Brother    Diabetes Brother    Hyperlipidemia Brother    Diabetes Maternal Grandmother    Stroke Maternal Grandfather    Stomach cancer Paternal Grandfather    Alcoholism Paternal Grandfather    Hypertension Sister    Hyperlipidemia Sister    Hypertension Sister    Hyperlipidemia Sister     Social History   Tobacco Use   Smoking status: Never   Smokeless tobacco: Never  Vaping Use   Vaping Use: Never used  Substance Use Topics   Alcohol use: Yes    Comment: socially   Drug use: Never     No Known Allergies  Review of Systems  Constitutional:  Negative for activity change, appetite change, fever and unexpected weight change.  HENT:  Negative for congestion.   Eyes:  Negative for visual disturbance.  Respiratory:  Negative for apnea,  cough and shortness of breath.   Cardiovascular:  Negative for chest pain, palpitations and leg swelling.  Gastrointestinal:  Negative for abdominal pain, blood in stool, constipation and diarrhea.  Endocrine: Negative for polydipsia, polyphagia and polyuria.  Genitourinary:  Negative for dysuria and pelvic pain.  Musculoskeletal:  Positive for arthralgias (R hip).  Skin:  Negative for rash.  Neurological:  Negative for dizziness, weakness and headaches.  Hematological:  Negative for adenopathy. Does not bruise/bleed easily.  Psychiatric/Behavioral:  Negative for  sleep disturbance and suicidal ideas. The patient is not nervous/anxious.        Objective:     BP (!) 180/76   Pulse (!) 56   Temp 98.2 F (36.8 C)   Ht 5' 5.5" (1.664 m)   Wt 199 lb 3.2 oz (90.4 kg)   LMP  (LMP Unknown)   SpO2 97%   BMI 32.64 kg/m   Wt Readings from Last 3 Encounters:  03/07/21 199 lb 3.2 oz (90.4 kg)  06/08/20 193 lb (87.5 kg)    BP Readings from Last 3 Encounters:  03/07/21 (!) 180/76  06/08/20 (!) 142/76     Physical Exam Vitals and nursing note reviewed.  Constitutional:      General: She is not in acute distress.    Appearance: Normal appearance. She is well-developed. She is not ill-appearing or toxic-appearing.  HENT:     Head: Normocephalic and atraumatic.     Right Ear: Tympanic membrane, ear canal and external ear normal. Tympanic membrane is not erythematous, retracted or bulging.     Left Ear: Tympanic membrane, ear canal and external ear normal. Tympanic membrane is not erythematous, retracted or bulging.  Eyes:     General: Lids are normal.     Conjunctiva/sclera: Conjunctivae normal.     Pupils: Pupils are equal, round, and reactive to light.  Neck:     Trachea: Trachea normal.  Cardiovascular:     Rate and Rhythm: Normal rate and regular rhythm.     Heart sounds: Normal heart sounds, S1 normal and S2 normal.  Pulmonary:     Effort: Pulmonary effort is normal. No tachypnea or respiratory distress.     Breath sounds: Normal breath sounds. No decreased breath sounds, wheezing, rhonchi or rales.  Abdominal:     General: Bowel sounds are normal.     Palpations: Abdomen is soft.     Tenderness: There is no abdominal tenderness.  Musculoskeletal:     Cervical back: Full passive range of motion without pain.     Right hip: Tenderness (anterior right hip, worse with flexion) present. Decreased range of motion.  Lymphadenopathy:     Cervical: No cervical adenopathy.  Skin:    General: Skin is warm and dry.  Neurological:     Mental  Status: She is alert.     GCS: GCS eye subscore is 4. GCS verbal subscore is 5. GCS motor subscore is 6.     Cranial Nerves: No cranial nerve deficit.     Sensory: No sensory deficit.     Deep Tendon Reflexes: Reflexes are normal and symmetric.  Psychiatric:        Speech: Speech normal.        Behavior: Behavior normal. Behavior is cooperative.       Assessment & Plan:   Problem List Items Addressed This Visit       Other   Family history of colonic polyps   Other Visit Diagnoses     Encounter for annual  physical exam    -  Primary   Relevant Orders   CBC with Differential/Platelet   Comprehensive metabolic panel   Lipid panel   Ambulatory referral to Gastroenterology   DG Bone Density   Diabetes mellitus screening       Relevant Orders   Comprehensive metabolic panel   Screening for cholesterol level       Relevant Orders   Lipid panel   Screening for colon cancer       Relevant Orders   Ambulatory referral to Gastroenterology   White coat syndrome with diagnosis of hypertension       Chronic right hip pain       Relevant Orders   DG HIP UNILAT W OR W/O PELVIS 2-3 VIEWS RIGHT   Ambulatory referral to Physical Therapy   Post-menopausal       Relevant Orders   DG Bone Density       Age-appropriate screening and counseling performed today. Will check labs and call with results. Preventive measures discussed and printed in AVS for patient. Bone density and colonoscopy referrals sent. She will come back for fasting labs. She is going to talk to her pharmacist about updating Shingrix and Tetanus. Overall doing a good job staying UTD with health and taking care of herself, staying active.   -Right hip pain - will plan for XRAY and PT. Referral to sports med or ortho if needed. Likely some components of arthritis affecting this area.   I,Havlyn C Ratchford,acting as a scribe for [employer], PA-C.,have documented all relevant documentation on the behalf of Tonie Elsey  M Teena Mangus, PA-C,as directed by  [employer], PA-C while in the presence of Siobhan Zaro M Axiel Fjeld, PA-C.  I, Elza Varricchio M Allisha Harter, PA-C, have reviewed all documentation for this visit. The documentation on 03/07/21 for the exam, diagnosis, procedures, and orders are all accurate and complete.

## 2021-03-08 ENCOUNTER — Other Ambulatory Visit (INDEPENDENT_AMBULATORY_CARE_PROVIDER_SITE_OTHER): Payer: BC Managed Care – PPO

## 2021-03-08 DIAGNOSIS — Z0000 Encounter for general adult medical examination without abnormal findings: Secondary | ICD-10-CM

## 2021-03-08 DIAGNOSIS — Z13220 Encounter for screening for lipoid disorders: Secondary | ICD-10-CM

## 2021-03-08 DIAGNOSIS — Z131 Encounter for screening for diabetes mellitus: Secondary | ICD-10-CM | POA: Diagnosis not present

## 2021-03-08 LAB — CBC WITH DIFFERENTIAL/PLATELET
Basophils Absolute: 0 10*3/uL (ref 0.0–0.1)
Basophils Relative: 0.5 % (ref 0.0–3.0)
Eosinophils Absolute: 0.1 10*3/uL (ref 0.0–0.7)
Eosinophils Relative: 2.7 % (ref 0.0–5.0)
HCT: 37.1 % (ref 36.0–46.0)
Hemoglobin: 12.5 g/dL (ref 12.0–15.0)
Lymphocytes Relative: 32.7 % (ref 12.0–46.0)
Lymphs Abs: 1.2 10*3/uL (ref 0.7–4.0)
MCHC: 33.6 g/dL (ref 30.0–36.0)
MCV: 87 fl (ref 78.0–100.0)
Monocytes Absolute: 0.3 10*3/uL (ref 0.1–1.0)
Monocytes Relative: 8.1 % (ref 3.0–12.0)
Neutro Abs: 2.1 10*3/uL (ref 1.4–7.7)
Neutrophils Relative %: 56 % (ref 43.0–77.0)
Platelets: 220 10*3/uL (ref 150.0–400.0)
RBC: 4.27 Mil/uL (ref 3.87–5.11)
RDW: 13.4 % (ref 11.5–15.5)
WBC: 3.8 10*3/uL — ABNORMAL LOW (ref 4.0–10.5)

## 2021-03-08 LAB — COMPREHENSIVE METABOLIC PANEL
ALT: 22 U/L (ref 0–35)
AST: 23 U/L (ref 0–37)
Albumin: 4.3 g/dL (ref 3.5–5.2)
Alkaline Phosphatase: 70 U/L (ref 39–117)
BUN: 10 mg/dL (ref 6–23)
CO2: 29 mEq/L (ref 19–32)
Calcium: 8.8 mg/dL (ref 8.4–10.5)
Chloride: 102 mEq/L (ref 96–112)
Creatinine, Ser: 0.78 mg/dL (ref 0.40–1.20)
GFR: 81.87 mL/min (ref >60.00)
Glucose, Bld: 89 mg/dL (ref 70–99)
Potassium: 4 mEq/L (ref 3.5–5.1)
Sodium: 139 mEq/L (ref 135–145)
Total Bilirubin: 1 mg/dL (ref 0.2–1.2)
Total Protein: 6.8 g/dL (ref 6.0–8.3)

## 2021-03-08 LAB — LIPID PANEL
Cholesterol: 239 mg/dL — ABNORMAL HIGH (ref 0–200)
HDL: 60.6 mg/dL (ref >39.00)
LDL Cholesterol: 161 mg/dL — ABNORMAL HIGH (ref 0–99)
NonHDL: 177.98
Total CHOL/HDL Ratio: 4
Triglycerides: 84 mg/dL (ref 0.0–149.0)
VLDL: 16.8 mg/dL (ref 0.0–40.0)

## 2021-03-09 ENCOUNTER — Encounter: Payer: Self-pay | Admitting: Physician Assistant

## 2021-03-10 ENCOUNTER — Encounter: Payer: Self-pay | Admitting: Physician Assistant

## 2021-03-10 ENCOUNTER — Other Ambulatory Visit: Payer: Self-pay | Admitting: Physician Assistant

## 2021-03-10 MED ORDER — ROSUVASTATIN CALCIUM 5 MG PO TABS: 5.0000 mg | ORAL_TABLET | Freq: Every day | ORAL | 3 refills | Status: DC

## 2021-03-14 ENCOUNTER — Other Ambulatory Visit: Payer: Self-pay

## 2021-03-14 MED ORDER — LISINOPRIL 20 MG PO TABS: 20.0000 mg | ORAL_TABLET | Freq: Every day | ORAL | 0 refills | Status: DC

## 2021-03-14 NOTE — Telephone Encounter (Signed)
Rx sent in patient notified 

## 2021-03-16 ENCOUNTER — Ambulatory Visit: Payer: BC Managed Care – PPO | Admitting: Physical Therapy

## 2021-03-22 ENCOUNTER — Encounter: Payer: Self-pay | Admitting: Physical Therapy

## 2021-03-22 ENCOUNTER — Other Ambulatory Visit: Payer: Self-pay

## 2021-03-22 ENCOUNTER — Ambulatory Visit (INDEPENDENT_AMBULATORY_CARE_PROVIDER_SITE_OTHER): Payer: BC Managed Care – PPO | Admitting: Physical Therapy

## 2021-03-22 DIAGNOSIS — M25551 Pain in right hip: Secondary | ICD-10-CM

## 2021-03-22 DIAGNOSIS — M25651 Stiffness of right hip, not elsewhere classified: Secondary | ICD-10-CM

## 2021-03-22 NOTE — Therapy (Signed)
[facility] [address] [place], [place], [address] Phone: [phone]   Fax:  [phone]  Physical Therapy Evaluation  Patient Details  Name: Catherine Yates MRN: [id number] Date of Birth: [birth year] Referring Provider (PT): Alyssa Allwardt   Encounter Date: 03/22/2021   PT End of Session - 03/22/21 0858     Visit Number 1    Number of Visits 16    Date for PT Re-Evaluation 05/17/21    Authorization Type BCBS    PT Start Time 0801    PT Stop Time 0842    PT Time Calculation (min) 41 min    Activity Tolerance Patient tolerated treatment well    Behavior During Therapy [facility] for tasks assessed/performed             Past Medical History:  Diagnosis Date   Fibroid    Hypertension     Past Surgical History:  Procedure Laterality Date   BREAST BIOPSY Left    approx 2009   ENDOMETRIAL ABLATION      There were no vitals filed for this visit.    Subjective Assessment - 03/22/21 0803     Subjective Pt rolled R ankle,  inversion, and had medial break, wore boot, in 2017. Thinks R hip started hurting then. Notes increased R hip pain with flexion, ER, as well as Stiffness.  Pain better with Movement . Does play tennis 1-2 x/wk and  walks up to 34mi when able.Has not had any treatment thus far for hip. Had recent x-ray, showing moderate arthritis in R, and mild in L hip. No pain in L hip at this time.    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    Patient Stated Goals decreased pain, increased movement    Currently in Pain? Yes    Pain Score 6     Pain Location Hip    Pain Orientation Right    Pain Descriptors / Indicators Aching    Pain Type Chronic pain    Pain Radiating Towards Pain radiates down R lateral hip, to knee.    Pain Onset More than a month ago    Pain Frequency Intermittent     Aggravating Factors  transfers, initial standing, standing too long                [facility] PT Assessment - 03/22/21 0001       Assessment   Medical Diagnosis R hip pain    Referring Provider (PT) Alyssa Allwardt    Prior Therapy no      Precautions   Precautions None      Balance Screen   Has the patient fallen in the past 6 months No      Prior Function   Level of Independence Independent      Cognition   Overall Cognitive Status Within Functional Limits for tasks assessed      Posture/Postural Control   Posture Comments Supine: L LE appears slightly shorter  than L. Standin: appears even.      ROM / Strength   AROM / PROM / Strength AROM;Strength      AROM   Overall AROM Comments L HIP: WFL,  R Hip: mild limitation for flexion with pain, IR limited due to pain, ER mild/mod limitation      Strength   Overall Strength Comments hips: 4+/5  Palpation   Palpation comment No tenderness in gr troch today, but pt states soreness here after standing/walking and tennis with radiating pain into lateral thigh to knee ,   hypomoble R hip joint      Special Tests   Other special tests + FADIR on R;      Ambulation/Gait   Gait Comments Slight R trunk SB with gait                        Objective measurements completed on examination: See above findings.       [facility] PT Treatment/Exercise - 03/22/21 0001       Exercises   Exercises Knee/Hip      Knee/Hip Exercises: Stretches   Piriformis Stretch 3 reps;30 seconds    Piriformis Stretch Limitations seated and modified fig 4 in supine    Other Knee/Hip Stretches SKTC 30 sec x 3 on R;      Knee/Hip Exercises: Sidelying   Hip ABduction 10 reps;Right                     PT Education - 03/22/21 0858     Education Details PT POC, Exam findings, HEP    Person(s) Educated Patient    Methods Explanation;Demonstration;Tactile cues;Handout;Verbal cues    Comprehension Verbalized  understanding;Returned demonstration;Verbal cues required;Tactile cues required;Need further instruction              PT Short Term Goals - 03/22/21 0900       PT SHORT TERM GOAL #1   Title Pt to be independent with initial HEP    Time 2    Period Weeks    Status New    Target Date 04/05/21               PT Long Term Goals - 03/22/21 0903       PT LONG TERM GOAL #1   Title Pt to be independent with final HEP    Time 8    Period Weeks    Target Date 05/17/21      PT LONG TERM GOAL #2   Title Pt to report decreased pain in R hip to 0-2/10 with transfers, and standing/walking activity.    Time 8    Period Weeks    Status New    Target Date 05/17/21      PT LONG TERM GOAL #3   Title Pt to demo improved ROM for flexion and ER and IR to be [facility] and pain free, to improve ability for functional activity and exercise.    Time 8    Period Weeks    Status New    Target Date 05/17/21                    Plan - 03/22/21 0907     Clinical Impression Statement Pt presents with primary complaint of increased pain in R hip, consistent with OA. She has joint stiffness and limited ROM on R. She has only mild strength deficits, but does have lack of effective HEP for her diagnosis. Pt with increased pain with transitions, as well as increased standing, walking, and exercise. She has mild gait deficit, with increased trunk SB to R. PT to benefit from skilled PT to improve stiffness, pain, strength, and to improve ability for functional activity without pain.    Personal Factors and Comorbidities Time since onset of injury/illness/exacerbation    Examination-Activity Limitations Stand;Locomotion  Level;Transfers;Stairs    Examination-Participation Restrictions Cleaning;Yard Work;Community Activity;Driving;Shop    Stability/Clinical Decision Making Stable/Uncomplicated    Clinical Decision Making Low    Rehab Potential Good    PT Frequency 2x / week    PT Duration 6 weeks     PT Treatment/Interventions ADLs/Self Care Home Management;Cryotherapy;Electrical Stimulation;DME Instruction;Ultrasound;Traction;Moist Heat;Iontophoresis 4mg /ml Dexamethasone;Gait training;Stair training;Functional mobility training;Therapeutic activities;Therapeutic exercise;Balance training;Orthotic Fit/Training;Patient/family education;Neuromuscular re-education;Manual techniques;Passive range of motion;Dry needling;Energy conservation;Taping;Spinal Manipulations;Joint Manipulations;Vasopneumatic Device    PT Home Exercise Plan [email or web address]    Consulted and Agree with Plan of Care Patient             Patient will benefit from skilled therapeutic intervention in order to improve the following deficits and impairments:  Abnormal gait, Pain, Increased muscle spasms, Decreased mobility, Decreased activity tolerance, Hypomobility, Decreased strength, Decreased range of motion, Impaired flexibility  Visit Diagnosis: Pain in right hip  Stiffness of hip joint, right     Problem List Patient Active Problem List   Diagnosis Date Noted   Colon cancer screening 06/08/2020   Family history of colonic polyps 06/08/2020   Obesity 06/08/2020    Lyndee Hensen, PT, DPT 9:11 AM  03/22/21    [address] [place], [place], [address] Phone: [phone]   Fax:  [phone]  Name: Orean Giarratano MRN: [id number] Date of Birth: 1960/05/25

## 2021-03-22 NOTE — Patient Instructions (Signed)
Access Code: [email or web address] URL: [email or web address] Date: 03/22/2021 Prepared by: Lyndee Hensen  Exercises Supine Single Knee to Chest - 2 x daily - 3 reps - 30 hold Supine Figure 4 Piriformis Stretch - 2 x daily - 3 reps - 30 hold Seated Figure 4 Piriformis Stretch - 2 x daily - 3 reps - 30 hold Sidelying Hip Abduction - 1 x daily - 2 sets - 10 reps

## 2021-03-29 ENCOUNTER — Telehealth: Payer: Self-pay | Admitting: Gastroenterology

## 2021-03-29 NOTE — Telephone Encounter (Signed)
Hey Dr. Tarri Glenn,   We received a referral for colonoscopy. Patient previous had procedure in 2018. Patient would like to see you as her provider. I have records and will send for review. Could you please review and advise on scheduling?  Thank you

## 2021-03-31 NOTE — Telephone Encounter (Signed)
I will look for those records.  

## 2021-04-05 ENCOUNTER — Encounter: Payer: BC Managed Care – PPO | Admitting: Physical Therapy

## 2021-04-07 ENCOUNTER — Encounter: Payer: BC Managed Care – PPO | Admitting: Physical Therapy

## 2021-04-10 ENCOUNTER — Encounter: Payer: BC Managed Care – PPO | Admitting: Physical Therapy

## 2021-04-10 NOTE — Telephone Encounter (Signed)
Colonoscopy with Dr. Collene Mares 04/16/2011 for colon cancer screening.  Exam revealed scattered sigmoid diverticulosis and 1 small sessile cecal polyp.  Otherwise the colonoscopy was normal to the terminal ileum.  Colonoscopy with Dr. Paulita Fujita 05/21/2016 for a personal history of colon polyps showed internal hemorrhoids and a few diverticula in the sigmoid colon.  Repeat colonoscopy recommended in 5 years.

## 2021-04-11 NOTE — Telephone Encounter (Signed)
Left vm to return call to schedule procedure

## 2021-04-12 ENCOUNTER — Encounter: Payer: BC Managed Care – PPO | Admitting: Physical Therapy

## 2021-04-20 ENCOUNTER — Other Ambulatory Visit: Payer: Self-pay

## 2021-04-20 ENCOUNTER — Encounter: Payer: Self-pay | Admitting: Physician Assistant

## 2021-04-20 ENCOUNTER — Ambulatory Visit: Payer: BC Managed Care – PPO | Admitting: Physician Assistant

## 2021-04-20 VITALS — BP 163/90 | HR 69 | Temp 98.2°F | Ht 66.0 in | Wt 195.4 lb

## 2021-04-20 DIAGNOSIS — M85851 Other specified disorders of bone density and structure, right thigh: Secondary | ICD-10-CM | POA: Diagnosis not present

## 2021-04-20 DIAGNOSIS — M25651 Stiffness of right hip, not elsewhere classified: Secondary | ICD-10-CM

## 2021-04-20 DIAGNOSIS — I10 Essential (primary) hypertension: Secondary | ICD-10-CM

## 2021-04-20 DIAGNOSIS — M25551 Pain in right hip: Secondary | ICD-10-CM

## 2021-04-20 MED ORDER — LISINOPRIL-HYDROCHLOROTHIAZIDE 20-12.5 MG PO TABS: 1.0000 | ORAL_TABLET | Freq: Every day | ORAL | 2 refills | Status: DC

## 2021-04-20 NOTE — Progress Notes (Signed)
Subjective:    Patient ID: Catherine Yates, female    DOB: 04/29/1961, 60 y.o.   MRN: [id number]  Chief Complaint  Patient presents with   Follow-up    HPI Patient is in today for recheck appointment.  Patient states that she has been taking lisinopril 20 mg daily.  She is not having any side effects from the medication.  She has not been tracking her blood pressure at home.  She has been under new stress with moving her mom into assisted living.  States things are calming down now and she is going to start tracking her blood pressure more consistently at home.  She is not having any headaches or vision changes.  No chest pain or shortness of breath.  No other symptoms going on.  Her right hip pain is beginning to improve.  She has been working with PT and doing what they say at home and says this is helped to loosen up some of the stiffness.  Her x-ray on 03/07/2021 showed moderate right and left arthritic changes and osteopenia. DEXA bone density scan scheduled for April.  She has been taking calcium and vitamin D supplementation daily.  Past Medical History:  Diagnosis Date   Fibroid    Hypertension     Past Surgical History:  Procedure Laterality Date   BREAST BIOPSY Left    approx 2009   ENDOMETRIAL ABLATION      Family History  Problem Relation Age of Onset   Breast cancer Mother    Hypertension Mother    Diabetes Mother    Congestive Heart Failure Mother    Renal Disease Mother    Breast cancer Maternal Aunt    Hypertension Sister    Hyperlipidemia Sister    Other Sister        breast hyperplasia   Heart Problems Father    Cancer Father    Diabetes Father    Hypertension Brother    Diabetes Brother    Hyperlipidemia Brother    Diabetes Maternal Grandmother    Stroke Maternal Grandfather    Stomach cancer Paternal Grandfather    Alcoholism Paternal Grandfather    Hypertension Sister    Hyperlipidemia Sister    Hypertension Sister    Hyperlipidemia  Sister     Social History   Tobacco Use   Smoking status: Never   Smokeless tobacco: Never  Vaping Use   Vaping Use: Never used  Substance Use Topics   Alcohol use: Yes    Comment: socially   Drug use: Never     No Known Allergies  Review of Systems NEGATIVE UNLESS OTHERWISE INDICATED IN HPI      Objective:     BP (!) 163/90    Pulse 69    Temp 98.2 F (36.8 C)    Ht 5\' 6"  (1.676 m)    Wt 195 lb 6.1 oz (88.6 kg)    LMP  (LMP Unknown)    SpO2 98%    BMI 31.54 kg/m   Wt Readings from Last 3 Encounters:  04/20/21 195 lb 6.1 oz (88.6 kg)  03/07/21 199 lb 3.2 oz (90.4 kg)  06/08/20 193 lb (87.5 kg)    BP Readings from Last 3 Encounters:  04/20/21 (!) 163/90  03/07/21 (!) 180/76  06/08/20 (!) 142/76     Physical Exam Vitals and nursing note reviewed.  Constitutional:      General: She is not in acute distress.    Appearance: Normal appearance. She  is well-developed. She is not ill-appearing or toxic-appearing.  HENT:     Head: Normocephalic and atraumatic.     Right Ear: External ear normal. Tympanic membrane is not erythematous, retracted or bulging.     Left Ear: External ear normal. Tympanic membrane is not erythematous, retracted or bulging.  Eyes:     General: Lids are normal.     Conjunctiva/sclera: Conjunctivae normal.     Pupils: Pupils are equal, round, and reactive to light.  Neck:     Trachea: Trachea normal.  Cardiovascular:     Rate and Rhythm: Normal rate and regular rhythm.     Heart sounds: Normal heart sounds, S1 normal and S2 normal.  Pulmonary:     Effort: Pulmonary effort is normal. No tachypnea or respiratory distress.     Breath sounds: Normal breath sounds. No decreased breath sounds, wheezing, rhonchi or rales.  Musculoskeletal:     Cervical back: Full passive range of motion without pain.     Right hip: Tenderness (anterior right hip, worse with flexion) present. Decreased range of motion.  Lymphadenopathy:     Cervical: No  cervical adenopathy.  Skin:    General: Skin is warm and dry.  Neurological:     Mental Status: She is alert.     GCS: GCS eye subscore is 4. GCS verbal subscore is 5. GCS motor subscore is 6.     Cranial Nerves: No cranial nerve deficit.     Sensory: No sensory deficit.     Deep Tendon Reflexes: Reflexes are normal and symmetric.  Psychiatric:        Speech: Speech normal.        Behavior: Behavior normal. Behavior is cooperative.       Assessment & Plan:   Problem List Items Addressed This Visit   None Visit Diagnoses     White coat syndrome with diagnosis of hypertension    -  Primary   Relevant Medications   lisinopril-hydrochlorothiazide (ZESTORETIC) 20-12.5 MG tablet   Pain in right hip       Stiffness of hip joint, right            Meds ordered this encounter  Medications   lisinopril-hydrochlorothiazide (ZESTORETIC) 20-12.5 MG tablet    Sig: Take 1 tablet by mouth daily.    Dispense:  30 tablet    Refill:  2   1. White coat syndrome with diagnosis of hypertension Readings are still elevated.  I am going to add 12.5 mg hydrochlorothiazide to her 20 mg of lisinopril.  She is going to start tracking daily and follow-up with me in the next few months.  She is working on low-salt diet and trying to walk more.  2. Pain in right hip 3. Stiffness of hip joint, right She is going to resume PT next week.  She is starting to feel better with home PT.  4. Osteopenia of right hip She is taking calcium and vitamin D supplement.  She has a bone density scan scheduled for April.   This note was prepared with assistance of Systems analyst. Occasional wrong-word or sound-a-like substitutions may have occurred due to the inherent limitations of voice recognition software.  Time Spent: 28 minutes of total time was spent on the date of the encounter performing the following actions: chart review prior to seeing the patient, obtaining history, performing a  medically necessary exam, counseling on the treatment plan, placing orders, and documenting in our EHR.    Krislynn Gronau  M Enyla Lisbon, PA-C

## 2021-04-26 ENCOUNTER — Other Ambulatory Visit: Payer: Self-pay

## 2021-04-26 ENCOUNTER — Encounter: Payer: Self-pay | Admitting: Physical Therapy

## 2021-04-26 ENCOUNTER — Ambulatory Visit (INDEPENDENT_AMBULATORY_CARE_PROVIDER_SITE_OTHER): Payer: BC Managed Care – PPO | Admitting: Physical Therapy

## 2021-04-26 DIAGNOSIS — M25651 Stiffness of right hip, not elsewhere classified: Secondary | ICD-10-CM | POA: Diagnosis not present

## 2021-04-26 DIAGNOSIS — M25551 Pain in right hip: Secondary | ICD-10-CM

## 2021-04-26 NOTE — Therapy (Signed)
[facility] [address] [place], [place], [address] Phone: [phone]   Fax:  [phone]  Physical Therapy Treatment  Patient Details  Name: Catherine Yates MRN: [id number] Date of Birth: 25-Mar-1961 Referring Provider (PT): Alyssa Allwardt   Encounter Date: 04/26/2021   PT End of Session - 04/26/21 0852     Visit Number 2    Number of Visits 16    Date for PT Re-Evaluation 05/17/21    Authorization Type BCBS    PT Start Time [phone]    PT Stop Time 0930    PT Time Calculation (min) 38 min    Activity Tolerance Patient tolerated treatment well    Behavior During Therapy [facility] for tasks assessed/performed             Past Medical History:  Diagnosis Date   Fibroid    Hypertension     Past Surgical History:  Procedure Laterality Date   BREAST BIOPSY Left    approx 2009   ENDOMETRIAL ABLATION      There were no vitals filed for this visit.   Subjective Assessment - 04/26/21 0854     Subjective States that she has been busy with her family and moving them into assisted living facility. States she has not had as much pain as it has been despite not being able to do her exercises as frequently. States low pain maybe 7/10 with hip flexion but states walking and standing up no longer hurt.    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    Patient Stated Goals decreased pain, increased movement    Currently in Pain? Yes    Pain Score 7     Pain Location Hip    Pain Orientation Right    Pain Descriptors / Indicators Aching;Sharp    Pain Type Chronic pain    Pain Onset More than a month ago                [facility] PT Assessment - 04/26/21 0001       Assessment   Medical Diagnosis R hip pain    Referring Provider (PT) Alyssa Allwardt                           [facility]  Adult PT Treatment/Exercise - 04/26/21 0001       Knee/Hip Exercises: Stretches   Piriformis Stretch 3 reps;30 seconds    Piriformis Stretch Limitations seated    Other Knee/Hip Stretches SKC x5 15" holds B    Other Knee/Hip Stretches prone hamstring curls x15 5" holds B      Knee/Hip Exercises: Supine   Bridges 2 sets;15 reps   5" holds   Straight Leg Raises AROM;4 sets;5 reps;Both   slow and controlled   Other Supine Knee/Hip Exercises bent knee fall outs and ins x20 B and each 5" holds in each direction      Knee/Hip Exercises: Prone   Hamstring Curl 4 sets;5 reps;5 seconds   both   Straight Leg Raises 4 sets;5 reps;Both                       PT Short Term Goals - 03/22/21 0900       PT SHORT TERM GOAL #1   Title Pt to be independent with  initial HEP    Time 2    Period Weeks    Status New    Target Date 04/05/21               PT Long Term Goals - 03/22/21 0903       PT LONG TERM GOAL #1   Title Pt to be independent with final HEP    Time 8    Period Weeks    Target Date 05/17/21      PT LONG TERM GOAL #2   Title Pt to report decreased pain in R hip to 0-2/10 with transfers, and standing/walking activity.    Time 8    Period Weeks    Status New    Target Date 05/17/21      PT LONG TERM GOAL #3   Title Pt to demo improved ROM for flexion and ER and IR to be [facility] and pain free, to improve ability for functional activity and exercise.    Time 8    Period Weeks    Status New    Target Date 05/17/21                   Plan - 04/26/21 0915     Clinical Impression Statement Able to progress exercises well with no reports of hip pain. Slight cramping noted initially with hamstring curls but this stopped after patient performed repetitions of this exercise. Patient with first follow up since November secondary to family needing to get settled with transition to assisted living facility and patient now able to focus on herself moving  forward. Will continue to progress mobility and strengthening exercises as tolerated.    Personal Factors and Comorbidities Time since onset of injury/illness/exacerbation    Examination-Activity Limitations Stand;Locomotion Level;Transfers;Stairs    Examination-Participation Restrictions Cleaning;Yard Work;Community Activity;Driving;Shop    Stability/Clinical Decision Making Stable/Uncomplicated    Rehab Potential Good    PT Frequency 2x / week    PT Duration 6 weeks    PT Treatment/Interventions ADLs/Self Care Home Management;Cryotherapy;Electrical Stimulation;DME Instruction;Ultrasound;Traction;Moist Heat;Iontophoresis 4mg /ml Dexamethasone;Gait training;Stair training;Functional mobility training;Therapeutic activities;Therapeutic exercise;Balance training;Orthotic Fit/Training;Patient/family education;Neuromuscular re-education;Manual techniques;Passive range of motion;Dry needling;Energy conservation;Taping;Spinal Manipulations;Joint Manipulations;Vasopneumatic Device    PT Home Exercise Plan [id number]    Consulted and Agree with Plan of Care Patient             Patient will benefit from skilled therapeutic intervention in order to improve the following deficits and impairments:  Abnormal gait, Pain, Increased muscle spasms, Decreased mobility, Decreased activity tolerance, Hypomobility, Decreased strength, Decreased range of motion, Impaired flexibility  Visit Diagnosis: Pain in right hip  Stiffness of hip joint, right     Problem List Patient Active Problem List   Diagnosis Date Noted   Colon cancer screening 06/08/2020   Family history of colonic polyps 06/08/2020   Obesity 06/08/2020   9:31 AM, 04/26/21 Jerene Pitch, DPT Physical Therapy with [facility]  [id number] office   [place], [place], [address] Phone: [phone]   Fax:  [phone]  Name: Catherine Yates MRN: [id number] Date of Birth: 1961-01-18

## 2021-05-04 ENCOUNTER — Encounter: Payer: Self-pay | Admitting: Physical Therapy

## 2021-05-04 ENCOUNTER — Ambulatory Visit (INDEPENDENT_AMBULATORY_CARE_PROVIDER_SITE_OTHER): Payer: BC Managed Care – PPO | Admitting: Physical Therapy

## 2021-05-04 ENCOUNTER — Other Ambulatory Visit: Payer: Self-pay

## 2021-05-04 DIAGNOSIS — M25551 Pain in right hip: Secondary | ICD-10-CM

## 2021-05-04 DIAGNOSIS — M25651 Stiffness of right hip, not elsewhere classified: Secondary | ICD-10-CM

## 2021-05-04 NOTE — Therapy (Signed)
[facility] [address] [place], [place], [address] Phone: [phone]   Fax:  [phone]  Physical Therapy Treatment  Patient Details  Name: Catherine Yates MRN: [id number] Date of Birth: 10-05-1960 Referring Provider (PT): Alyssa Allwardt   Encounter Date: 05/04/2021   PT End of Session - 05/04/21 1552     Visit Number 3    Number of Visits 16    Date for PT Re-Evaluation 05/17/21    Authorization Type BCBS    PT Start Time 9735    PT Stop Time 1633    PT Time Calculation (min) 42 min    Activity Tolerance Patient tolerated treatment well    Behavior During Therapy [facility] for tasks assessed/performed             Past Medical History:  Diagnosis Date   Fibroid    Hypertension     Past Surgical History:  Procedure Laterality Date   BREAST BIOPSY Left    approx 2009   ENDOMETRIAL ABLATION      There were no vitals filed for this visit.   Subjective Assessment - 05/04/21 1552     Subjective Pt states improving pain in hip. Has had minimal soreness. Feels that she is doing better with transfers, transitions, and also improved with sleeping.    Currently in Pain? Yes    Pain Score 2     Pain Location Hip    Pain Orientation Right    Pain Descriptors / Indicators Aching    Pain Type Chronic pain    Pain Onset More than a month ago    Pain Frequency Intermittent                               [facility] Adult PT Treatment/Exercise - 05/04/21 0001       Knee/Hip Exercises: Stretches   Piriformis Stretch 3 reps;30 seconds    Piriformis Stretch Limitations supine fig 4 ;    Other Knee/Hip Stretches SKC x5 15" holds B    Other Knee/Hip Stretches prone hamstring curls x15 5" holds B      Knee/Hip Exercises: Aerobic   Recumbent Bike L1 x 7 min;      Knee/Hip Exercises: Standing   Other Standing Knee Exercises Walk/march 10 ft x 4, fwd/bwd      Knee/Hip Exercises: Supine   Bridges 2 sets;15  reps   5" holds   Straight Leg Raises AROM;10 reps;2 sets;Both   slow and controlled   Other Supine Knee/Hip Exercises Clam Blue TB x 20    Other Supine Knee/Hip Exercises bent knee fall outs and ins x20 B and each 5" holds in each direction      Knee/Hip Exercises: Sidelying   Hip ABduction 15 reps;Right      Knee/Hip Exercises: Prone   Hamstring Curl --    Straight Leg Raises --      Manual Therapy   Manual Therapy Joint mobilization;Soft tissue mobilization;Manual Traction    Manual therapy comments manual R quad and hip flexor stretch    Joint Mobilization hip post mobs, inf and lat mobs w strap;    Manual Traction long leg distraction for R hip x 2 min;                       PT Short Term Goals - 03/22/21 0900       PT SHORT TERM  GOAL #1   Title Pt to be independent with initial HEP    Time 2    Period Weeks    Status New    Target Date 04/05/21               PT Long Term Goals - 03/22/21 0903       PT LONG TERM GOAL #1   Title Pt to be independent with final HEP    Time 8    Period Weeks    Target Date 05/17/21      PT LONG TERM GOAL #2   Title Pt to report decreased pain in R hip to 0-2/10 with transfers, and standing/walking activity.    Time 8    Period Weeks    Status New    Target Date 05/17/21      PT LONG TERM GOAL #3   Title Pt to demo improved ROM for flexion and ER and IR to be [facility] and pain free, to improve ability for functional activity and exercise.    Time 8    Period Weeks    Status New    Target Date 05/17/21                   Plan - 05/04/21 1637     Clinical Impression Statement Pt with improving pain. She has good ability for ther ex today, without increase pain with strengthening. She does have pain in groin with hip IR and ER. Discussed decreasing fig 4 stretch if painful. Pt to benefit from continued strengthening in pain free ranges.    Personal Factors and Comorbidities Time since onset of  injury/illness/exacerbation    Examination-Activity Limitations Stand;Locomotion Level;Transfers;Stairs    Examination-Participation Restrictions Cleaning;Yard Work;Community Activity;Driving;Shop    Stability/Clinical Decision Making Stable/Uncomplicated    Rehab Potential Good    PT Frequency 2x / week    PT Duration 6 weeks    PT Treatment/Interventions ADLs/Self Care Home Management;Cryotherapy;Electrical Stimulation;DME Instruction;Ultrasound;Traction;Moist Heat;Iontophoresis 4mg /ml Dexamethasone;Gait training;Stair training;Functional mobility training;Therapeutic activities;Therapeutic exercise;Balance training;Orthotic Fit/Training;Patient/family education;Neuromuscular re-education;Manual techniques;Passive range of motion;Dry needling;Energy conservation;Taping;Spinal Manipulations;Joint Manipulations;Vasopneumatic Device    PT Home Exercise Plan [id number]    Consulted and Agree with Plan of Care Patient             Patient will benefit from skilled therapeutic intervention in order to improve the following deficits and impairments:  Abnormal gait, Pain, Increased muscle spasms, Decreased mobility, Decreased activity tolerance, Hypomobility, Decreased strength, Decreased range of motion, Impaired flexibility  Visit Diagnosis: Pain in right hip  Stiffness of hip joint, right     Problem List Patient Active Problem List   Diagnosis Date Noted   Colon cancer screening 06/08/2020   Family history of colonic polyps 06/08/2020   Obesity 06/08/2020   Lyndee Hensen, PT, DPT 4:40 PM  05/04/21    [place], [place], [address] Phone: [phone]   Fax:  [phone]  Name: Shauntae Reitman MRN: [id number] Date of Birth: 10/24/1960

## 2021-05-09 ENCOUNTER — Encounter: Payer: BC Managed Care – PPO | Admitting: Physical Therapy

## 2021-05-09 ENCOUNTER — Encounter: Payer: Self-pay | Admitting: Physical Therapy

## 2021-05-09 ENCOUNTER — Other Ambulatory Visit: Payer: Self-pay

## 2021-05-09 ENCOUNTER — Ambulatory Visit (INDEPENDENT_AMBULATORY_CARE_PROVIDER_SITE_OTHER): Payer: BC Managed Care – PPO | Admitting: Physical Therapy

## 2021-05-09 DIAGNOSIS — M25651 Stiffness of right hip, not elsewhere classified: Secondary | ICD-10-CM

## 2021-05-09 DIAGNOSIS — M25551 Pain in right hip: Secondary | ICD-10-CM | POA: Diagnosis not present

## 2021-05-09 NOTE — Therapy (Signed)
[facility] [address] [place], [place], [address] Phone: [phone]   Fax:  [phone]  Physical Therapy Treatment  Patient Details  Name: Catherine Yates MRN: [id number] Date of Birth: Mar 11, 1961 Referring Provider (PT): Alyssa Allwardt   Encounter Date: 05/09/2021   PT End of Session - 05/09/21 0859     Visit Number 4    Number of Visits 16    Date for PT Re-Evaluation 05/17/21    Authorization Type BCBS    PT Start Time 0850    PT Stop Time 0930    PT Time Calculation (min) 40 min    Activity Tolerance Patient tolerated treatment well    Behavior During Therapy [facility] for tasks assessed/performed             Past Medical History:  Diagnosis Date   Fibroid    Hypertension     Past Surgical History:  Procedure Laterality Date   BREAST BIOPSY Left    approx 2009   ENDOMETRIAL ABLATION      There were no vitals filed for this visit.   Subjective Assessment - 05/09/21 1032     Subjective Pt states more stiffness in hip this am. She did a lot of activity/cleaning at home yesterday.    Currently in Pain? Yes    Pain Score 2     Pain Location Hip    Pain Orientation Right    Pain Descriptors / Indicators Aching    Pain Type Chronic pain    Pain Onset More than a month ago    Pain Frequency Intermittent                               [facility] Adult PT Treatment/Exercise - 05/09/21 0001       Knee/Hip Exercises: Stretches   Hip Flexor Stretch 20 seconds;3 reps    Hip Flexor Stretch Limitations off side of table.    Other Knee/Hip Stretches SKC x5 15" holds B    Other Knee/Hip Stretches LTR 5 sec x 10;      Knee/Hip Exercises: Aerobic   Recumbent Bike L1 x 8 min;      Knee/Hip Exercises: Seated   Sit to Sand 10 reps      Knee/Hip Exercises: Supine   Bridges 2 sets;15 reps   5" holds   Straight Leg Raises AROM;10 reps;2 sets;Both   slow and controlled   Other Supine Knee/Hip  Exercises Clam Blue TB x 20    Other Supine Knee/Hip Exercises bent knee fall outs and ins x20 B and each 5" holds in each direction      Manual Therapy   Manual Therapy Joint mobilization;Soft tissue mobilization;Manual Traction    Manual therapy comments manual R quad and hip flexor stretch    Joint Mobilization --    Manual Traction long leg distraction for R hip x 2 min;                     PT Education - 05/09/21 1033     Education Details reviewed HEP    Person(s) Educated Patient    Methods Explanation;Demonstration;Tactile cues;Verbal cues;Handout    Comprehension Verbalized understanding;Returned demonstration;Verbal cues required;Tactile cues required;Need further instruction              PT Short Term Goals - 03/22/21 0900       PT SHORT TERM GOAL #1  Title Pt to be independent with initial HEP    Time 2    Period Weeks    Status New    Target Date 04/05/21               PT Long Term Goals - 03/22/21 0903       PT LONG TERM GOAL #1   Title Pt to be independent with final HEP    Time 8    Period Weeks    Target Date 05/17/21      PT LONG TERM GOAL #2   Title Pt to report decreased pain in R hip to 0-2/10 with transfers, and standing/walking activity.    Time 8    Period Weeks    Status New    Target Date 05/17/21      PT LONG TERM GOAL #3   Title Pt to demo improved ROM for flexion and ER and IR to be [facility] and pain free, to improve ability for functional activity and exercise.    Time 8    Period Weeks    Status New    Target Date 05/17/21                   Plan - 05/09/21 1033     Clinical Impression Statement Pt with good ability for ther ex today. She has good ability for strengthening and mobility,does have sorness at end range flexion with stretching today. She has decreased soreness and stiffness after session today. Discussed frequent mobility and position changes during the day and while at work. Plan to  continue mobility and strengthenig progressions for decreased pain.    Personal Factors and Comorbidities Time since onset of injury/illness/exacerbation    Examination-Activity Limitations Stand;Locomotion Level;Transfers;Stairs    Examination-Participation Restrictions Cleaning;Yard Work;Community Activity;Driving;Shop    Stability/Clinical Decision Making Stable/Uncomplicated    Rehab Potential Good    PT Frequency 2x / week    PT Duration 6 weeks    PT Treatment/Interventions ADLs/Self Care Home Management;Cryotherapy;Electrical Stimulation;DME Instruction;Ultrasound;Traction;Moist Heat;Iontophoresis 4mg /ml Dexamethasone;Gait training;Stair training;Functional mobility training;Therapeutic activities;Therapeutic exercise;Balance training;Orthotic Fit/Training;Patient/family education;Neuromuscular re-education;Manual techniques;Passive range of motion;Dry needling;Energy conservation;Taping;Spinal Manipulations;Joint Manipulations;Vasopneumatic Device    PT Home Exercise Plan [id number]    Consulted and Agree with Plan of Care Patient             Patient will benefit from skilled therapeutic intervention in order to improve the following deficits and impairments:  Abnormal gait, Pain, Increased muscle spasms, Decreased mobility, Decreased activity tolerance, Hypomobility, Decreased strength, Decreased range of motion, Impaired flexibility  Visit Diagnosis: Pain in right hip  Stiffness of hip joint, right     Problem List Patient Active Problem List   Diagnosis Date Noted   Colon cancer screening 06/08/2020   Family history of colonic polyps 06/08/2020   Obesity 06/08/2020    Lyndee Hensen, PT, DPT 10:35 AM  05/09/21    [place], [place], [address] Phone: [phone]   Fax:  [phone]  Name: Catherine Yates MRN: [id number] Date of Birth: 26-Mar-1961

## 2021-05-11 ENCOUNTER — Other Ambulatory Visit: Payer: Self-pay

## 2021-05-11 ENCOUNTER — Ambulatory Visit (INDEPENDENT_AMBULATORY_CARE_PROVIDER_SITE_OTHER): Payer: BC Managed Care – PPO | Admitting: Physical Therapy

## 2021-05-11 ENCOUNTER — Encounter: Payer: Self-pay | Admitting: Physical Therapy

## 2021-05-11 DIAGNOSIS — M25651 Stiffness of right hip, not elsewhere classified: Secondary | ICD-10-CM | POA: Diagnosis not present

## 2021-05-11 DIAGNOSIS — M25551 Pain in right hip: Secondary | ICD-10-CM | POA: Diagnosis not present

## 2021-05-11 NOTE — Therapy (Signed)
[facility] [address] [employer] [place], [place], [address] Phone: [phone]   Fax:  [phone]  Physical Therapy Treatment  Patient Details  Name: Catherine Yates MRN: [id number] Date of Birth: 26-Jan-1961 Referring Provider (PT): Alyssa Allwardt   Encounter Date: 05/11/2021   PT End of Session - 05/11/21 1136     Visit Number 5    Number of Visits 16    Date for PT Re-Evaluation 05/17/21    Authorization Type BCBS    PT Start Time 0930    PT Stop Time 1010    PT Time Calculation (min) 40 min    Activity Tolerance Patient tolerated treatment well    Behavior During Therapy [facility] for tasks assessed/performed             Past Medical History:  Diagnosis Date   Fibroid    Hypertension     Past Surgical History:  Procedure Laterality Date   BREAST BIOPSY Left    approx 2009   ENDOMETRIAL ABLATION      There were no vitals filed for this visit.   Subjective Assessment - 05/11/21 1135     Subjective Subjective States that she has some pain with sit to stands, 4/10 in the groin but no pain in the back of her right hip. States that she played tennis last night without difficulties.    Patient Stated Goals decreased pain, increased movement    Currently in Pain? Yes    Pain Score 4     Pain Location Hip    Pain Orientation Right    Pain Descriptors / Indicators Aching    Pain Type Chronic pain    Pain Onset More than a month ago    Pain Frequency Intermittent                               [facility] Adult PT Treatment/Exercise - 05/11/21 0001       Knee/Hip Exercises: Stretches   Other Knee/Hip Stretches SKC x5 15" holds B    Other Knee/Hip Stretches LTR 5 sec x 10;      Knee/Hip Exercises: Aerobic   Recumbent Bike L1 x 8 min;      Knee/Hip Exercises: Standing   Other Standing Knee Exercises Band walks Blue TB 10 ft x 6;    Other Standing Knee Exercises Crossover step ups 4 step x5 bilat, 6  step 3x5 bilat      Knee/Hip Exercises: Seated   Sit to Sand 10 reps;3 sets   with blue band at thighs.     Knee/Hip Exercises: Supine   Bridges 15 reps;1 set   5" holds   Bridges with Clamshell 2 sets;15 reps   blue TB   Straight Leg Raises --   slow and controlled   Other Supine Knee/Hip Exercises bent knee fall outs and ins x20 B and each 5" holds in each direction      Manual Therapy   Manual Therapy Joint mobilization;Soft tissue mobilization;Manual Traction    Manual therapy comments manual R quad and hip flexor stretch    Manual Traction long leg distraction for R hip x 2 min;                       PT Short Term Goals - 03/22/21 0900       PT SHORT TERM GOAL #1   Title Pt to be  independent with initial HEP    Time 2    Period Weeks    Status New    Target Date 04/05/21               PT Long Term Goals - 03/22/21 0903       PT LONG TERM GOAL #1   Title Pt to be independent with final HEP    Time 8    Period Weeks    Target Date 05/17/21      PT LONG TERM GOAL #2   Title Pt to report decreased pain in R hip to 0-2/10 with transfers, and standing/walking activity.    Time 8    Period Weeks    Status New    Target Date 05/17/21      PT LONG TERM GOAL #3   Title Pt to demo improved ROM for flexion and ER and IR to be [facility] and pain free, to improve ability for functional activity and exercise.    Time 8    Period Weeks    Status New    Target Date 05/17/21                   Plan - 05/11/21 1137     Clinical Impression Statement Patient with reports of pinching pan with transitions. Performed sit to stands with blue band with cues to press out into band and down into floor. Pain was abolished with transition when patient was focusing on these cues. Added banded sit to stands and lateral stepping to HEP. No pain noted end of session just muscle fatigue. Will continue with current POC as tolerated.    Personal Factors and Comorbidities  Time since onset of injury/illness/exacerbation    Examination-Activity Limitations Stand;Locomotion Level;Transfers;Stairs    Examination-Participation Restrictions Cleaning;Yard Work;Community Activity;Driving;Shop    Stability/Clinical Decision Making Stable/Uncomplicated    Rehab Potential Good    PT Frequency 2x / week    PT Duration 6 weeks    PT Treatment/Interventions ADLs/Self Care Home Management;Cryotherapy;Electrical Stimulation;DME Instruction;Ultrasound;Traction;Moist Heat;Iontophoresis 4mg /ml Dexamethasone;Gait training;Stair training;Functional mobility training;Therapeutic activities;Therapeutic exercise;Balance training;Orthotic Fit/Training;Patient/family education;Neuromuscular re-education;Manual techniques;Passive range of motion;Dry needling;Energy conservation;Taping;Spinal Manipulations;Joint Manipulations;Vasopneumatic Device    PT Home Exercise Plan [id number]    Consulted and Agree with Plan of Care Patient             Patient will benefit from skilled therapeutic intervention in order to improve the following deficits and impairments:  Abnormal gait, Pain, Increased muscle spasms, Decreased mobility, Decreased activity tolerance, Hypomobility, Decreased strength, Decreased range of motion, Impaired flexibility  Visit Diagnosis: Pain in right hip  Stiffness of hip joint, right     Problem List Patient Active Problem List   Diagnosis Date Noted   Colon cancer screening 06/08/2020   Family history of colonic polyps 06/08/2020   Obesity 06/08/2020    Lyndee Hensen, PT, DPT 11:39 AM  05/11/21    [facility] [place], [place], [address] Phone: [phone]   Fax:  [phone]  Name: Catherine Yates MRN: [id number] Date of Birth: 07/07/1960

## 2021-05-15 ENCOUNTER — Ambulatory Visit (INDEPENDENT_AMBULATORY_CARE_PROVIDER_SITE_OTHER): Payer: BC Managed Care – PPO | Admitting: Physical Therapy

## 2021-05-15 ENCOUNTER — Other Ambulatory Visit: Payer: Self-pay

## 2021-05-15 ENCOUNTER — Encounter: Payer: Self-pay | Admitting: Physical Therapy

## 2021-05-15 DIAGNOSIS — M25551 Pain in right hip: Secondary | ICD-10-CM | POA: Diagnosis not present

## 2021-05-15 DIAGNOSIS — M25651 Stiffness of right hip, not elsewhere classified: Secondary | ICD-10-CM

## 2021-05-15 NOTE — Therapy (Signed)
[facility] [address] [place], [place], [address] Phone: [phone]   Fax:  [phone]  Physical Therapy Treatment  Patient Details  Name: Catherine Yates MRN: [id number] Date of Birth: 10-30-1960 Referring Provider (PT): Alyssa Allwardt   Encounter Date: 05/15/2021   PT End of Session - 05/15/21 1407     Visit Number 6    Number of Visits 16    Date for PT Re-Evaluation 05/17/21    Authorization Type BCBS    PT Start Time 0807    PT Stop Time 0850    PT Time Calculation (min) 43 min    Activity Tolerance Patient tolerated treatment well    Behavior During Therapy [facility] for tasks assessed/performed             Past Medical History:  Diagnosis Date   Fibroid    Hypertension     Past Surgical History:  Procedure Laterality Date   BREAST BIOPSY Left    approx 2009   ENDOMETRIAL ABLATION      There were no vitals filed for this visit.   Subjective Assessment - 05/15/21 1410     Subjective Pt states less pain, doing well with exercises. Stil has some soreness in groin/adductor region.    Currently in Pain? Yes    Pain Score 4     Pain Location Hip    Pain Orientation Right    Pain Descriptors / Indicators Aching    Pain Type Chronic pain    Pain Onset More than a month ago    Pain Frequency Intermittent                               [facility] Adult PT Treatment/Exercise - 05/15/21 0001       Knee/Hip Exercises: Stretches   Hip Flexor Stretch 20 seconds;3 reps    Hip Flexor Stretch Limitations off side of table. and standing at counter.    Other Knee/Hip Stretches --    Other Knee/Hip Stretches --      Knee/Hip Exercises: Aerobic   Recumbent Bike L2 x 8 min;      Knee/Hip Exercises: Standing   Other Standing Knee Exercises Walk/march 10 ft x 4;    Other Standing Knee Exercises --      Knee/Hip Exercises: Seated   Sit to Sand --   with blue band at thighs.     Knee/Hip  Exercises: Supine   Bridges 15 reps;1 set   5" holds   Bridges with Clamshell --    Straight Leg Raises --    Other Supine Knee/Hip Exercises bent knee fall outs and ins x20 B and each 5" holds in each direction      Knee/Hip Exercises: Prone   Hamstring Curl 10 reps    Straight Leg Raises 10 reps      Manual Therapy   Manual Therapy Joint mobilization;Soft tissue mobilization;Manual Traction    Manual therapy comments --    Joint Mobilization R hip: inf mobs.    Soft tissue mobilization STM/DTM to R Adductor    Manual Traction long leg distraction for R hip x 2 min;                     PT Education - 05/15/21 1411     Education Details reviewed and updated HEP    Person(s) Educated Patient    Methods Explanation;Demonstration;Tactile cues;Verbal cues  Comprehension Verbalized understanding;Returned demonstration;Verbal cues required;Tactile cues required;Need further instruction              PT Short Term Goals - 03/22/21 0900       PT SHORT TERM GOAL #1   Title Pt to be independent with initial HEP    Time 2    Period Weeks    Status New    Target Date 04/05/21               PT Long Term Goals - 03/22/21 0903       PT LONG TERM GOAL #1   Title Pt to be independent with final HEP    Time 8    Period Weeks    Target Date 05/17/21      PT LONG TERM GOAL #2   Title Pt to report decreased pain in R hip to 0-2/10 with transfers, and standing/walking activity.    Time 8    Period Weeks    Status New    Target Date 05/17/21      PT LONG TERM GOAL #3   Title Pt to demo improved ROM for flexion and ER and IR to be [facility] and pain free, to improve ability for functional activity and exercise.    Time 8    Period Weeks    Status New    Target Date 05/17/21                   Plan - 05/15/21 1411     Clinical Impression Statement Pt with more tenderness in proximal adductor today. Tightness and tenderness noted with palpation,  addressed with manual tissue release today- pt to benefit from continued work on this as well as possible dry needling for muscle tension and pain. Plan to progress strengthening and ther ex as able.    Personal Factors and Comorbidities Time since onset of injury/illness/exacerbation    Examination-Activity Limitations Stand;Locomotion Level;Transfers;Stairs    Examination-Participation Restrictions Cleaning;Yard Work;Community Activity;Driving;Shop    Stability/Clinical Decision Making Stable/Uncomplicated    Rehab Potential Good    PT Frequency 2x / week    PT Duration 6 weeks    PT Treatment/Interventions ADLs/Self Care Home Management;Cryotherapy;Electrical Stimulation;DME Instruction;Ultrasound;Traction;Moist Heat;Iontophoresis 4mg /ml Dexamethasone;Gait training;Stair training;Functional mobility training;Therapeutic activities;Therapeutic exercise;Balance training;Orthotic Fit/Training;Patient/family education;Neuromuscular re-education;Manual techniques;Passive range of motion;Dry needling;Energy conservation;Taping;Spinal Manipulations;Joint Manipulations;Vasopneumatic Device    PT Home Exercise Plan [id number]    Consulted and Agree with Plan of Care Patient             Patient will benefit from skilled therapeutic intervention in order to improve the following deficits and impairments:  Abnormal gait, Pain, Increased muscle spasms, Decreased mobility, Decreased activity tolerance, Hypomobility, Decreased strength, Decreased range of motion, Impaired flexibility  Visit Diagnosis: Pain in right hip  Stiffness of hip joint, right     Problem List Patient Active Problem List   Diagnosis Date Noted   Colon cancer screening 06/08/2020   Family history of colonic polyps 06/08/2020   Obesity 06/08/2020    Lyndee Hensen, PT, DPT 2:13 PM  05/15/21    [place], [place], [address] Phone: [phone]   Fax:   [phone]  Name: Catherine Yates MRN: [id number] Date of Birth: 23-Nov-1960

## 2021-05-17 ENCOUNTER — Other Ambulatory Visit: Payer: Self-pay

## 2021-05-17 ENCOUNTER — Ambulatory Visit (INDEPENDENT_AMBULATORY_CARE_PROVIDER_SITE_OTHER): Payer: BC Managed Care – PPO | Admitting: Physical Therapy

## 2021-05-17 ENCOUNTER — Encounter: Payer: Self-pay | Admitting: Physical Therapy

## 2021-05-17 DIAGNOSIS — M25651 Stiffness of right hip, not elsewhere classified: Secondary | ICD-10-CM | POA: Diagnosis not present

## 2021-05-17 DIAGNOSIS — M25551 Pain in right hip: Secondary | ICD-10-CM | POA: Diagnosis not present

## 2021-05-17 NOTE — Therapy (Signed)
[facility] [address] [place], [place], [address] Phone: [phone]   Fax:  [phone]  Physical Therapy Treatment/Re-Cert   Patient Details  Name: Catherine Yates MRN: [id number] Date of Birth: [birth year] Referring Provider (PT): Alyssa Allwardt   Encounter Date: 05/17/2021   PT End of Session - 05/17/21 0851     Visit Number 7    Number of Visits 20    Date for PT Re-Evaluation 06/28/21    Authorization Type BCBS  recert done at visit 7    PT Start Time 0804    PT Stop Time 0855    PT Time Calculation (min) 51 min    Activity Tolerance Patient tolerated treatment well    Behavior During Therapy [facility] for tasks assessed/performed             Past Medical History:  Diagnosis Date   Fibroid    Hypertension     Past Surgical History:  Procedure Laterality Date   BREAST BIOPSY Left    approx 2009   ENDOMETRIAL ABLATION      There were no vitals filed for this visit.   Subjective Assessment - 05/17/21 0853     Subjective Pt states hip doing better overall, adductor still sore, was a bit sore after last session.    Currently in Pain? Yes    Pain Score 2     Pain Orientation Right    Pain Descriptors / Indicators Aching    Pain Type Chronic pain    Pain Onset More than a month ago    Pain Frequency Intermittent                [facility] PT Assessment - 05/17/21 0808       Assessment   Medical Diagnosis R hip pain    Referring Provider (PT) Alyssa Allwardt    Prior Therapy no      Precautions   Precautions None      Prior Function   Level of Independence Independent      Cognition   Overall Cognitive Status Within Functional Limits for tasks assessed      Posture/Postural Control   Posture Comments Supine: L LE appears slightly shorter  than L. Standing: appears even.      AROM   Overall AROM Comments L HIP: WFL,  R Hip:WFL with mild pain, pain, IR mild limitation  with pain, ER WFL       Strength   Overall Strength Comments hips: 4+/5      Palpation   Palpation comment Tenderness in R adductors(proximal)      Special Tests   Other special tests + FADIR on R;                           [facility] Adult PT Treatment/Exercise - 05/17/21 0808       Ambulation/Gait   Gait Comments --      Exercises   Exercises Knee/Hip      Knee/Hip Exercises: Stretches   Hip Flexor Stretch 20 seconds;3 reps    Hip Flexor Stretch Limitations off side of table.    Piriformis Stretch --    Piriformis Stretch Limitations --    Other Knee/Hip Stretches --      Knee/Hip Exercises: Aerobic   Recumbent Bike L2 x 7 min;      Knee/Hip Exercises: Standing   Hip Abduction 10 reps;2 sets;Both    Other Standing Knee  Exercises --    Other Standing Knee Exercises Step ups 6 in x 10 bil; Crossover step ups 6 step x10  bilat      Knee/Hip Exercises: Supine   Bridges 15 reps;1 set   5" holds   Other Supine Knee/Hip Exercises bent knee fall outs and ins x20 B and each 5" holds in each direction      Knee/Hip Exercises: Sidelying   Hip ABduction --      Knee/Hip Exercises: Prone   Hamstring Curl --    Straight Leg Raises --      Manual Therapy   Manual Therapy Joint mobilization;Soft tissue mobilization;Manual Traction    Joint Mobilization R hip: inf mobs and lat mobs with strap    Soft tissue mobilization STM/DTM to R Adductor    Manual Traction long leg distraction for R hip x 2 min;                     PT Education - 05/17/21 0853     Education Details updated and reviewed HEP    Person(s) Educated Patient    Methods Explanation;Demonstration;Tactile cues;Verbal cues    Comprehension Verbalized understanding;Returned demonstration;Verbal cues required;Tactile cues required;Need further instruction              PT Short Term Goals - 05/17/21 0854       PT SHORT TERM GOAL #1   Title Pt to be independent with initial HEP    Time 2    Period Weeks     Status Achieved    Target Date 04/05/21               PT Long Term Goals - 05/17/21 0854       PT LONG TERM GOAL #1   Title Pt to be independent with final HEP    Time 8    Period Weeks    Status Partially Met    Target Date 06/28/21      PT LONG TERM GOAL #2   Title Pt to report decreased pain in R hip to 0-2/10 with transfers, and standing/walking activity.    Time 8    Period Weeks    Status Partially Met    Target Date 06/28/21      PT LONG TERM GOAL #3   Title Pt to demo improved ROM for flexion and ER and IR to be [facility] and pain free, to improve ability for functional activity and exercise.    Time 8    Period Weeks    Status On-going    Target Date 06/28/21      PT LONG TERM GOAL #4   Title Pt to demo soft tissue limitations in adductor to be WNL and pain free    Time 6    Period Weeks    Status New    Target Date 06/28/21                   Plan - 05/17/21 0900     Clinical Impression Statement Pt with tenderness in R adductor today. Quite tender with palpation and manual, possible dry needling in future, but likley too tender today for needling. Pt reports overall pain improving, down to 1-2/10 with activity. She does have pain at end range of hip flexion and with IR motion, could be consistent with OA. She is doing well with strengthening for hips and with functional activity and HEP. Pt progressing well, will require continuation of skilled  PT to decrease pain in hip and adductor, and to meet LTGs.    Personal Factors and Comorbidities Time since onset of injury/illness/exacerbation    Examination-Activity Limitations Stand;Locomotion Level;Transfers;Stairs    Examination-Participation Restrictions Cleaning;Yard Work;Community Activity;Driving;Shop    Stability/Clinical Decision Making Stable/Uncomplicated    Rehab Potential Good    PT Frequency 2x / week    PT Duration 6 weeks    PT Treatment/Interventions ADLs/Self Care Home  Management;Cryotherapy;Electrical Stimulation;DME Instruction;Ultrasound;Traction;Moist Heat;Iontophoresis 4mg /ml Dexamethasone;Gait training;Stair training;Functional mobility training;Therapeutic activities;Therapeutic exercise;Balance training;Orthotic Fit/Training;Patient/family education;Neuromuscular re-education;Manual techniques;Passive range of motion;Dry needling;Energy conservation;Taping;Spinal Manipulations;Joint Manipulations;Vasopneumatic Device    PT Home Exercise Plan [id number]    Consulted and Agree with Plan of Care Patient             Patient will benefit from skilled therapeutic intervention in order to improve the following deficits and impairments:  Abnormal gait, Pain, Increased muscle spasms, Decreased mobility, Decreased activity tolerance, Hypomobility, Decreased strength, Decreased range of motion, Impaired flexibility  Visit Diagnosis: Pain in right hip  Stiffness of hip joint, right     Problem List Patient Active Problem List   Diagnosis Date Noted   Colon cancer screening 06/08/2020   Family history of colonic polyps 06/08/2020   Obesity 06/08/2020   Lyndee Hensen, PT, DPT 9:03 AM  05/17/21    [place], [place], [address] Phone: [phone]   Fax:  [phone]  Name: Manali Mcelmurry MRN: [id number] Date of Birth: [birth year]

## 2021-05-23 ENCOUNTER — Ambulatory Visit (INDEPENDENT_AMBULATORY_CARE_PROVIDER_SITE_OTHER): Payer: BC Managed Care – PPO | Admitting: Physical Therapy

## 2021-05-23 ENCOUNTER — Other Ambulatory Visit: Payer: Self-pay

## 2021-05-23 ENCOUNTER — Encounter: Payer: Self-pay | Admitting: Physical Therapy

## 2021-05-23 DIAGNOSIS — M25651 Stiffness of right hip, not elsewhere classified: Secondary | ICD-10-CM | POA: Diagnosis not present

## 2021-05-23 DIAGNOSIS — M25551 Pain in right hip: Secondary | ICD-10-CM

## 2021-05-23 NOTE — Therapy (Signed)
[facility] [address] [place], [place], [address] Phone: [phone]   Fax:  [phone]  Physical Therapy Treatment  Patient Details  Name: Catherine Yates MRN: [id number] Date of Birth: [birth year] Referring Provider (PT): Alyssa Allwardt   Encounter Date: 05/23/2021   PT End of Session - 05/23/21 1232     Visit Number 8    Number of Visits 20    Date for PT Re-Evaluation 06/28/21    Authorization Type BCBS  recert done at visit 7    PT Start Time 0805    PT Stop Time 0845    PT Time Calculation (min) 40 min    Activity Tolerance Patient tolerated treatment well    Behavior During Therapy [facility] for tasks assessed/performed             Past Medical History:  Diagnosis Date   Fibroid    Hypertension     Past Surgical History:  Procedure Laterality Date   BREAST BIOPSY Left    approx 2009   ENDOMETRIAL ABLATION      There were no vitals filed for this visit.   Subjective Assessment - 05/23/21 1235     Subjective Pt states less pain, has had no pain since last visit. Adductor muscle still "sore to touch". She was able to walk 5 miles yesterday.    Currently in Pain? No/denies    Pain Score 0-No pain                               [facility] Adult PT Treatment/Exercise - 05/23/21 0001       Posture/Postural Control   Posture Comments --      Exercises   Exercises Knee/Hip      Knee/Hip Exercises: Stretches   Hip Flexor Stretch 20 seconds;3 reps    Hip Flexor Stretch Limitations standing and supine off side of table    Other Knee/Hip Stretches Knee fallouts for add stretch 10 sec x 10;      Knee/Hip Exercises: Aerobic   Recumbent Bike L2 x 8 min;      Knee/Hip Exercises: Standing   Hip Abduction 10 reps;2 sets;Both    Other Standing Knee Exercises Marching x 25;    Other Standing Knee Exercises Step ups 6 in x 10 bil;      Knee/Hip Exercises: Supine   Bridges 20 reps   5" holds      Manual Therapy   Manual Therapy Joint mobilization;Soft tissue mobilization;Manual Traction    Joint Mobilization R hip: inf mobs and lat mobs with strap    Soft tissue mobilization STM/DTM to R Adductor    Manual Traction long leg distraction for R hip x 2 min;                       PT Short Term Goals - 05/17/21 0854       PT SHORT TERM GOAL #1   Title Pt to be independent with initial HEP    Time 2    Period Weeks    Status Achieved    Target Date 04/05/21               PT Long Term Goals - 05/17/21 0854       PT LONG TERM GOAL #1   Title Pt to be independent with final HEP    Time 8    Period  Weeks    Status Partially Met    Target Date 06/28/21      PT LONG TERM GOAL #2   Title Pt to report decreased pain in R hip to 0-2/10 with transfers, and standing/walking activity.    Time 8    Period Weeks    Status Partially Met    Target Date 06/28/21      PT LONG TERM GOAL #3   Title Pt to demo improved ROM for flexion and ER and IR to be [facility] and pain free, to improve ability for functional activity and exercise.    Time 8    Period Weeks    Status On-going    Target Date 06/28/21      PT LONG TERM GOAL #4   Title Pt to demo soft tissue limitations in adductor to be WNL and pain free    Time 6    Period Weeks    Status New    Target Date 06/28/21                   Plan - 05/23/21 1236     Clinical Impression Statement Pt progressing well. She is doing well with ability for increased activity in sessions and at home, without pain. She does have pain in adductor with palpation and manual today. Will continue PT at 1x/wk for further work on this.    Personal Factors and Comorbidities Time since onset of injury/illness/exacerbation    Examination-Activity Limitations Stand;Locomotion Level;Transfers;Stairs    Examination-Participation Restrictions Cleaning;Yard Work;Community Activity;Driving;Shop    Stability/Clinical Decision Making  Stable/Uncomplicated    Rehab Potential Good    PT Frequency 2x / week    PT Duration 6 weeks    PT Treatment/Interventions ADLs/Self Care Home Management;Cryotherapy;Electrical Stimulation;DME Instruction;Ultrasound;Traction;Moist Heat;Iontophoresis 4mg /ml Dexamethasone;Gait training;Stair training;Functional mobility training;Therapeutic activities;Therapeutic exercise;Balance training;Orthotic Fit/Training;Patient/family education;Neuromuscular re-education;Manual techniques;Passive range of motion;Dry needling;Energy conservation;Taping;Spinal Manipulations;Joint Manipulations;Vasopneumatic Device    PT Home Exercise Plan [id number]    Consulted and Agree with Plan of Care Patient             Patient will benefit from skilled therapeutic intervention in order to improve the following deficits and impairments:  Abnormal gait, Pain, Increased muscle spasms, Decreased mobility, Decreased activity tolerance, Hypomobility, Decreased strength, Decreased range of motion, Impaired flexibility  Visit Diagnosis: Pain in right hip  Stiffness of hip joint, right     Problem List Patient Active Problem List   Diagnosis Date Noted   Colon cancer screening 06/08/2020   Family history of colonic polyps 06/08/2020   Obesity 06/08/2020   Lyndee Hensen, PT, DPT 12:38 PM  05/23/21    [place], [place], [address] Phone: [phone]   Fax:  [phone]  Name: Catherine Yates MRN: [id number] Date of Birth: 12/21/1960

## 2021-05-26 NOTE — Telephone Encounter (Signed)
Reached out to patient, stated that she is looking to schedule in April. Stated that she will call back in February.

## 2021-05-30 ENCOUNTER — Other Ambulatory Visit: Payer: Self-pay

## 2021-05-30 ENCOUNTER — Encounter: Payer: Self-pay | Admitting: Physical Therapy

## 2021-05-30 ENCOUNTER — Ambulatory Visit (INDEPENDENT_AMBULATORY_CARE_PROVIDER_SITE_OTHER): Payer: BC Managed Care – PPO | Admitting: Physical Therapy

## 2021-05-30 DIAGNOSIS — M25651 Stiffness of right hip, not elsewhere classified: Secondary | ICD-10-CM | POA: Diagnosis not present

## 2021-05-30 DIAGNOSIS — M25551 Pain in right hip: Secondary | ICD-10-CM | POA: Diagnosis not present

## 2021-05-30 NOTE — Therapy (Signed)
[name] [address] [place], [place], [address] Phone: [phone]   Fax:  [phone]  Physical Therapy Treatment/Discharge  Patient Details  Name: Catherine Yates MRN: [id number] Date of Birth: 1961/01/12 Referring Provider (PT): Alyssa Allwardt   Encounter Date: 05/30/2021   PT End of Session - 05/30/21 0808     Visit Number 9    Number of Visits 20    Date for PT Re-Evaluation 06/28/21    Authorization Type BCBS  recert done at visit 7    PT Start Time 0802    PT Stop Time 0840    PT Time Calculation (min) 38 min    Activity Tolerance Patient tolerated treatment well    Behavior During Therapy [facility] for tasks assessed/performed             Past Medical History:  Diagnosis Date   Fibroid    Hypertension     Past Surgical History:  Procedure Laterality Date   BREAST BIOPSY Left    approx 2009   ENDOMETRIAL ABLATION      There were no vitals filed for this visit.   Subjective Assessment - 05/30/21 1407     Subjective Pt states doing well. She has mild stiffness today, but muscle pain in groin is doing much better. Overall, she is having little pain.    Currently in Pain? No/denies    Pain Score 0-No pain                               [facility] Adult PT Treatment/Exercise - 05/30/21 0001       Exercises   Exercises Knee/Hip      Knee/Hip Exercises: Stretches   Hip Flexor Stretch 20 seconds;3 reps    Hip Flexor Stretch Limitations standing and supine off side of table    Piriformis Stretch 2 reps;20 seconds    Piriformis Stretch Limitations supine, painful in seated.    Other Knee/Hip Stretches Knee fallouts for add stretch 10 sec x 10;    Other Knee/Hip Stretches hip adductor stretch in standing x 2 min;      Knee/Hip Exercises: Aerobic   Recumbent Bike L2 x 7 min;      Knee/Hip Exercises: Standing   Hip Abduction 10 reps;2 sets;Both    Other Standing Knee Exercises Marching  x 25;    Other Standing Knee Exercises --      Knee/Hip Exercises: Supine   Bridges 20 reps   5" holds   Straight Leg Raises 10 reps;2 sets;Both      Knee/Hip Exercises: Sidelying   Hip ABduction 2 sets;10 reps;Both      Manual Therapy   Manual Therapy Joint mobilization;Soft tissue mobilization;Manual Traction    Joint Mobilization --    Soft tissue mobilization --    Manual Traction long leg distraction for R hip x 2 min;                     PT Education - 05/30/21 1410     Education Details updated and reviewed final HEP    Person(s) Educated Patient    Methods Explanation;Demonstration;Tactile cues;Verbal cues;Handout    Comprehension Verbalized understanding;Returned demonstration;Verbal cues required;Tactile cues required;Need further instruction              PT Short Term Goals - 05/17/21 0854       PT SHORT TERM GOAL #1   Title  Pt to be independent with initial HEP    Time 2    Period Weeks    Status Achieved    Target Date 04/05/21               PT Long Term Goals - 05/30/21 1413       PT LONG TERM GOAL #1   Title Pt to be independent with final HEP    Time 8    Period Weeks    Status Partially Met    Target Date 06/28/21      PT LONG TERM GOAL #2   Title Pt to report decreased pain in R hip to 0-2/10 with transfers, and standing/walking activity.    Time 8    Period Weeks    Status Achieved    Target Date 06/28/21      PT LONG TERM GOAL #3   Title Pt to demo improved ROM for flexion and ER and IR to be [facility] and pain free, to improve ability for functional activity and exercise.    Baseline Still has mild pain with hip IR .    Time 8    Period Weeks    Status Partially Met    Target Date 06/28/21      PT LONG TERM GOAL #4   Title Pt to demo soft tissue limitations in adductor to be WNL and pain free    Time 6    Period Weeks    Status Achieved    Target Date 06/28/21                   Plan - 05/30/21 1410      Clinical Impression Statement Pt doing very well at this time, with little/no pain. She has mild stiffness at times in am, consistent with arthritis. Final HEP reviewed, with importance and best care for OA. Pt feels ready for d/c to HEP at this time. Will continue HEP. Gave pt info for referral to sports med MD, if pain worsens or changes in the future.    Personal Factors and Comorbidities Time since onset of injury/illness/exacerbation    Examination-Activity Limitations Stand;Locomotion Level;Transfers;Stairs    Examination-Participation Restrictions Cleaning;Yard Work;Community Activity;Driving;Shop    Stability/Clinical Decision Making Stable/Uncomplicated    Rehab Potential Good    PT Frequency 2x / week    PT Duration 6 weeks    PT Treatment/Interventions ADLs/Self Care Home Management;Cryotherapy;Electrical Stimulation;DME Instruction;Ultrasound;Traction;Moist Heat;Iontophoresis 4mg /ml Dexamethasone;Gait training;Stair training;Functional mobility training;Therapeutic activities;Therapeutic exercise;Balance training;Orthotic Fit/Training;Patient/family education;Neuromuscular re-education;Manual techniques;Passive range of motion;Dry needling;Energy conservation;Taping;Spinal Manipulations;Joint Manipulations;Vasopneumatic Device    PT Home Exercise Plan [email or web address]    Consulted and Agree with Plan of Care Patient             Patient will benefit from skilled therapeutic intervention in order to improve the following deficits and impairments:  Abnormal gait, Pain, Increased muscle spasms, Decreased mobility, Decreased activity tolerance, Hypomobility, Decreased strength, Decreased range of motion, Impaired flexibility  Visit Diagnosis: Pain in right hip  Stiffness of hip joint, right     Problem List Patient Active Problem List   Diagnosis Date Noted   Colon cancer screening 06/08/2020   Family history of colonic polyps 06/08/2020   Obesity 06/08/2020   Lyndee Hensen,  PT, DPT 2:16 PM  05/30/21    [facility] [place], [place], [address] Phone: [phone]   Fax:  [phone]  Name: Catherine Yates MRN: [id number] Date of Birth: [birth year]  PHYSICAL THERAPY DISCHARGE SUMMARY  Visits from Start of Care: 9  Plan: Patient agrees to discharge.  Patient goals were met. Patient is being discharged due to meeting the stated rehab goals.      Lyndee Hensen, PT, DPT 2:16 PM  05/30/21

## 2021-05-30 NOTE — Patient Instructions (Signed)
Access Code: [email or web address] URL: [email or web address] Date: 05/30/2021 Prepared by: Lyndee Hensen  Exercises Supine Single Knee to Chest - 2 x daily - 3 reps - 30 hold Supine Figure 4 Piriformis Stretch - 2 x daily - 3 reps - 30 hold Bent Knee Fallouts - 15 reps - 5 hold Side Lunge Adductor Stretch - 2 x daily - 5 reps - 20 hold Modified Thomas Stretch - 2 x daily - 3 reps - 30 hold Sidelying Hip Abduction - 1 x daily - 2 sets - 10 reps Active Straight Leg Raise with Quad Set - 4 sets - 5 reps Supine Bridge - 2 sets - 15 reps Sidestepping in Squat with Resistance and Arms Forward Sit to Stand with Resistance Around Legs - 3 sets - 10 reps

## 2021-06-07 NOTE — Progress Notes (Signed)
61 y.o. G0P0000 Married White or Caucasian Not Hispanic or Latino female here for annual exam.  No vaginal bleeding.   No bowel or bladder c/o.   She was started on a statin at the end of last year.    Husband had a partial prostatectomy last year. Able to be sexually active. Some discomfort, not as bad as it used to be, using lubricant.    She is under stress, her mother is in assisted living and not doing well.   No LMP recorded (lmp unknown). Patient is postmenopausal.          Sexually active: Yes.    The current method of family planning is post menopausal status.    Exercising: Yes.     Walking, Tennis, PT  Smoker:  no  Health Maintenance: Pap:  06/08/20 WNL Hr hpv Neg  History of abnormal Pap:  no MMG:  02/21/21 Bi-rads 1 neg  BMD:   02/18/12 normal ( has scheduled)  Colonoscopy: 2017 polyp, she is in the process of scheduling one.  TDaP:  unsure thinks she might be due.  Gardasil: n/a   reports that she has never smoked. She has never used smokeless tobacco. She reports current alcohol use. She reports that she does not use drugs. ~4 glasses of wine a week. She is a Music therapist at [employer]. Plans to go down to 20 hours a week in 3/23. Husband is retired.   Past Medical History:  Diagnosis Date   Elevated cholesterol    Fibroid    Hypertension     Past Surgical History:  Procedure Laterality Date   BREAST BIOPSY Left    approx 2009   ENDOMETRIAL ABLATION     EYE SURGERY Right     Current Outpatient Medications  Medication Sig Dispense Refill   Azelaic Acid 15 % cream Apply to affected areas daily.     Azelastine HCl 0.15 % SOLN Place 2 sprays into both nostrils daily.     lisinopril (ZESTRIL) 20 MG tablet Take 1 tablet (20 mg total) by mouth daily. 90 tablet 0   rosuvastatin (CRESTOR) 5 MG tablet Take 1 tablet (5 mg total) by mouth daily. 90 tablet 3   tretinoin (RETIN-A) 0.025 % cream Apply pea-sized amount to face and neck nightly.     tretinoin  microspheres (RETIN-A MICRO) 0.04 % gel Apply topically.     lisinopril-hydrochlorothiazide (ZESTORETIC) 20-12.5 MG tablet Take 1 tablet by mouth daily. 30 tablet 2   No current facility-administered medications for this visit.    Family History  Problem Relation Age of Onset   Breast cancer Mother    Hypertension Mother    Diabetes Mother    Congestive Heart Failure Mother    Renal Disease Mother    Breast cancer Maternal Aunt    Hypertension Sister    Hyperlipidemia Sister    Other Sister        breast hyperplasia   Heart Problems Father    Cancer Father    Diabetes Father    Hypertension Brother    Diabetes Brother    Hyperlipidemia Brother    Diabetes Maternal Grandmother    Stroke Maternal Grandfather    Stomach cancer Paternal Grandfather    Alcoholism Paternal Grandfather    Hypertension Sister    Hyperlipidemia Sister    Hypertension Sister    Hyperlipidemia Sister     Review of Systems  All other systems reviewed and are negative.  Exam:   BP  128/64    Pulse 73    Ht 5\' 5"  (1.651 m)    Wt 198 lb 3.2 oz (89.9 kg)    LMP  (LMP Unknown)    SpO2 100%    BMI 32.98 kg/m   Weight change: @WEIGHTCHANGE @ Height:   Height: 5\' 5"  (165.1 cm)  Ht Readings from Last 3 Encounters:  06/12/21 5\' 5"  (1.651 m)  04/20/21 5\' 6"  (1.676 m)  03/07/21 5' 5.5" (1.664 m)    General appearance: alert, cooperative and appears stated age Head: Normocephalic, without obvious abnormality, atraumatic Neck: no adenopathy, supple, symmetrical, trachea midline and thyroid normal to inspection and palpation Lungs: clear to auscultation bilaterally Cardiovascular: regular rate and rhythm Breasts: normal appearance, no masses or tenderness Abdomen: soft, non-tender; non distended,  no masses,  no organomegaly Extremities: extremities normal, atraumatic, no cyanosis or edema Skin: Skin color, texture, turgor normal. No rashes or lesions Lymph nodes: Cervical, supraclavicular, and axillary  nodes normal. No abnormal inguinal nodes palpated Neurologic: Grossly normal   Pelvic: External genitalia:  no lesions              Urethra:  normal appearing urethra with no masses, tenderness or lesions              Bartholins and Skenes: normal                 Vagina: atrophic appearing vagina with normal color and discharge, no lesions              Cervix: no lesions               Bimanual Exam:  Uterus:  normal size, contour, position, consistency, mobility, non-tender              Adnexa: no mass, fullness, tenderness               Rectovaginal: Confirms               Anus:  normal sphincter tone, no lesions  Gae Dry chaperoned for the exam.  1. Well woman exam Discussed breast self exam Discussed calcium and vit D intake Mammogram is UTD She is scheduling her colonoscopy  2. Immunization due - Tdap vaccine greater than or equal to 7yo IM  3. Vaginal atrophy Mild dyspareunia with lubrication. Discussed vaginal estrogen, she would like to try it.  - Estradiol 10 MCG TABS vaginal tablet; Place one tablet vaginally qhs x 1 week, then change to 2 x a week  Dispense: 24 tablet; Refill: 4

## 2021-06-10 ENCOUNTER — Other Ambulatory Visit: Payer: Self-pay | Admitting: Physician Assistant

## 2021-06-12 ENCOUNTER — Ambulatory Visit (INDEPENDENT_AMBULATORY_CARE_PROVIDER_SITE_OTHER): Payer: BC Managed Care – PPO | Admitting: Obstetrics and Gynecology

## 2021-06-12 ENCOUNTER — Other Ambulatory Visit: Payer: Self-pay

## 2021-06-12 ENCOUNTER — Encounter: Payer: Self-pay | Admitting: Obstetrics and Gynecology

## 2021-06-12 VITALS — BP 128/64 | HR 73 | Ht 65.0 in | Wt 198.2 lb

## 2021-06-12 DIAGNOSIS — Z23 Encounter for immunization: Secondary | ICD-10-CM | POA: Diagnosis not present

## 2021-06-12 DIAGNOSIS — L719 Rosacea, unspecified: Secondary | ICD-10-CM | POA: Insufficient documentation

## 2021-06-12 DIAGNOSIS — Z01419 Encounter for gynecological examination (general) (routine) without abnormal findings: Secondary | ICD-10-CM

## 2021-06-12 DIAGNOSIS — N952 Postmenopausal atrophic vaginitis: Secondary | ICD-10-CM

## 2021-06-12 DIAGNOSIS — E7800 Pure hypercholesterolemia, unspecified: Secondary | ICD-10-CM | POA: Insufficient documentation

## 2021-06-12 MED ORDER — ESTRADIOL 10 MCG VA TABS: ORAL_TABLET | VAGINAL | 4 refills | Status: DC

## 2021-06-12 NOTE — Patient Instructions (Signed)
Try uberlube for vaginal lubrication (order on line).  EXERCISE   We recommended that you start or continue a regular exercise program for good health. Physical activity is anything that gets your body moving, some is better than none. The CDC recommends 150 minutes per week of Moderate-Intensity Aerobic Activity and 2 or more days of Muscle Strengthening Activity.  Benefits of exercise are limitless: helps weight loss/weight maintenance, improves mood and energy, helps with depression and anxiety, improves sleep, tones and strengthens muscles, improves balance, improves bone density, protects from chronic conditions such as heart disease, high blood pressure and diabetes and so much more. To learn more visit: WhyNotPoker.uy  DIET: Good nutrition starts with a healthy diet of fruits, vegetables, whole grains, and lean protein sources. Drink plenty of water for hydration. Minimize empty calories, sodium, sweets. For more information about dietary recommendations visit: GeekRegister.com.ee and http://schaefer-mitchell.com/  ALCOHOL:  Women should limit their alcohol intake to no more than 7 drinks/beers/glasses of wine (combined, not each!) per week. Moderation of alcohol intake to this level decreases your risk of breast cancer and liver damage.  If you are concerned that you may have a problem, or your friends have told you they are concerned about your drinking, there are many resources to help. A well-known program that is free, effective, and available to all people all over the nation is Alcoholics Anonymous.  Check out this site to learn more: BlockTaxes.se   CALCIUM AND VITAMIN D:  Adequate intake of calcium and Vitamin D are recommended for bone health.  You should be getting between 1000-1200 mg of calcium and 800 units of Vitamin D daily between diet and supplements  PAP SMEARS:  Pap smears, to check for  cervical cancer or precancers,  have traditionally been done yearly, scientific advances have shown that most women can have pap smears less often.  However, every woman still should have a physical exam from her gynecologist every year. It will include a breast check, inspection of the vulva and vagina to check for abnormal growths or skin changes, a visual exam of the cervix, and then an exam to evaluate the size and shape of the uterus and ovaries. We will also provide age appropriate advice regarding health maintenance, like when you should have certain vaccines, screening for sexually transmitted diseases, bone density testing, colonoscopy, mammograms, etc.   MAMMOGRAMS:  All women over 56 years old should have a routine mammogram.   COLON CANCER SCREENING: Now recommend starting at age 49. At this time colonoscopy is not covered for routine screening until 50. There are take home tests that can be done between 45-49.   COLONOSCOPY:  Colonoscopy to screen for colon cancer is recommended for all women at age 54.  We know, you hate the idea of the prep.  We agree, BUT, having colon cancer and not knowing it is worse!!  Colon cancer so often starts as a polyp that can be seen and removed at colonscopy, which can quite literally save your life!  And if your first colonoscopy is normal and you have no family history of colon cancer, most women don't have to have it again for 10 years.  Once every ten years, you can do something that may end up saving your life, right?  We will be happy to help you get it scheduled when you are ready.  Be sure to check your insurance coverage so you understand how much it will cost.  It may be covered as a  preventative service at no cost, but you should check your particular policy.      Breast Self-Awareness Breast self-awareness means being familiar with how your breasts look and feel. It involves checking your breasts regularly and reporting any changes to your health  care provider. Practicing breast self-awareness is important. A change in your breasts can be a sign of a serious medical problem. Being familiar with how your breasts look and feel allows you to find any problems early, when treatment is more likely to be successful. All women should practice breast self-awareness, including women who have had breast implants. How to do a breast self-exam One way to learn what is normal for your breasts and whether your breasts are changing is to do a breast self-exam. To do a breast self-exam: Look for Changes  Remove all the clothing above your waist. Stand in front of a mirror in a room with good lighting. Put your hands on your hips. Push your hands firmly downward. Compare your breasts in the mirror. Look for differences between them (asymmetry), such as: Differences in shape. Differences in size. Puckers, dips, and bumps in one breast and not the other. Look at each breast for changes in your skin, such as: Redness. Scaly areas. Look for changes in your nipples, such as: Discharge. Bleeding. Dimpling. Redness. A change in position. Feel for Changes Carefully feel your breasts for lumps and changes. It is best to do this while lying on your back on the floor and again while sitting or standing in the shower or tub with soapy water on your skin. Feel each breast in the following way: Place the arm on the side of the breast you are examining above your head. Feel your breast with the other hand. Start in the nipple area and make  inch (2 cm) overlapping circles to feel your breast. Use the pads of your three middle fingers to do this. Apply light pressure, then medium pressure, then firm pressure. The light pressure will allow you to feel the tissue closest to the skin. The medium pressure will allow you to feel the tissue that is a little deeper. The firm pressure will allow you to feel the tissue close to the ribs. Continue the overlapping circles,  moving downward over the breast until you feel your ribs below your breast. Move one finger-width toward the center of the body. Continue to use the  inch (2 cm) overlapping circles to feel your breast as you move slowly up toward your collarbone. Continue the up and down exam using all three pressures until you reach your armpit.  Write Down What You Find  Write down what is normal for each breast and any changes that you find. Keep a written record with breast changes or normal findings for each breast. By writing this information down, you do not need to depend only on memory for size, tenderness, or location. Write down where you are in your menstrual cycle, if you are still menstruating. If you are having trouble noticing differences in your breasts, do not get discouraged. With time you will become more familiar with the variations in your breasts and more comfortable with the exam. How often should I examine my breasts? Examine your breasts every month. If you are breastfeeding, the best time to examine your breasts is after a feeding or after using a breast pump. If you menstruate, the best time to examine your breasts is 5-7 days after your period is over. During your period,  your breasts are lumpier, and it may be more difficult to notice changes. When should I see my health care provider? See your health care provider if you notice: A change in shape or size of your breasts or nipples. A change in the skin of your breast or nipples, such as a reddened or scaly area. Unusual discharge from your nipples. A lump or thick area that was not there before. Pain in your breasts. Anything that concerns you.

## 2021-07-17 ENCOUNTER — Other Ambulatory Visit: Payer: Self-pay | Admitting: Physician Assistant

## 2021-08-05 ENCOUNTER — Other Ambulatory Visit: Payer: Self-pay | Admitting: Obstetrics and Gynecology

## 2021-08-05 DIAGNOSIS — N952 Postmenopausal atrophic vaginitis: Secondary | ICD-10-CM

## 2021-08-07 NOTE — Telephone Encounter (Signed)
AEX 06/12/21. ?Mammo 02/16/21. ? ?Pharmacy requesting 90day Rx. However, except for initial when having to take qhs for a week and then twice weekly =29 patches for 45monthsupply. The rest should be in 90day supply. Please advise ?

## 2021-08-07 NOTE — Telephone Encounter (Signed)
She only needs 24 tablets for refills, 2 tablets a week. I called in 24 with 4 refills. No refill should be needed.  ?

## 2021-08-08 DIAGNOSIS — H25013 Cortical age-related cataract, bilateral: Secondary | ICD-10-CM | POA: Diagnosis not present

## 2021-08-08 DIAGNOSIS — H25043 Posterior subcapsular polar age-related cataract, bilateral: Secondary | ICD-10-CM | POA: Diagnosis not present

## 2021-08-08 DIAGNOSIS — H2513 Age-related nuclear cataract, bilateral: Secondary | ICD-10-CM | POA: Diagnosis not present

## 2021-08-08 DIAGNOSIS — H18413 Arcus senilis, bilateral: Secondary | ICD-10-CM | POA: Diagnosis not present

## 2021-08-08 DIAGNOSIS — H2511 Age-related nuclear cataract, right eye: Secondary | ICD-10-CM | POA: Diagnosis not present

## 2021-08-14 ENCOUNTER — Ambulatory Visit: Payer: BC Managed Care – PPO

## 2021-08-14 ENCOUNTER — Ambulatory Visit: Payer: BC Managed Care – PPO | Admitting: Podiatry

## 2021-08-14 ENCOUNTER — Encounter: Payer: Self-pay | Admitting: Podiatry

## 2021-08-14 DIAGNOSIS — S9030XA Contusion of unspecified foot, initial encounter: Secondary | ICD-10-CM

## 2021-08-14 DIAGNOSIS — M79671 Pain in right foot: Secondary | ICD-10-CM

## 2021-08-14 DIAGNOSIS — L600 Ingrowing nail: Secondary | ICD-10-CM

## 2021-08-14 DIAGNOSIS — M2042 Other hammer toe(s) (acquired), left foot: Secondary | ICD-10-CM

## 2021-08-16 NOTE — Progress Notes (Signed)
Subjective:  ? ?Patient ID: Catherine Yates, female   DOB: 61 y.o.   MRN: [id number]  ? ?HPI ?Patient presents stating she injured her right foot and also has ingrown toenail and hammertoe deformity that she wanted checked.  Patient states that it seems to be getting better but she wants to have this looked at.  Patient does not smoke likes to be active ? ? ?Review of Systems  ?All other systems reviewed and are negative. ? ? ?   ?Objective:  ?Physical Exam ?Vitals and nursing note reviewed.  ?Constitutional:   ?   Appearance: She is well-developed.  ?Pulmonary:  ?   Effort: Pulmonary effort is normal.  ?Musculoskeletal:     ?   General: Normal range of motion.  ?Skin: ?   General: Skin is warm.  ?Neurological:  ?   Mental Status: She is alert.  ?  ?Neurovascular status found to be intact muscle strength adequate range of motion within normal limits mild elevation lesser digit incurvated nail bed low-grade no redness no erythema edema and mild contusion swelling but no other indications of pathology ? ?   ?Assessment:  ?Patient has several different problems digital deformity ingrown toenail deformity and contusion ? ?   ?Plan:  ?H&P x-ray reviewed recommended ice therapy wider shoe gear and consideration for ingrown toenail review section which I discussed with her today.  At this point patient will be seen back as needed encouraged to call questions concerns ? ?X-rays do indicate moderate digital deformity with elevation of the digit and no indications fracture or acute pathology ?   ? ? ?

## 2021-08-17 ENCOUNTER — Ambulatory Visit
Admission: RE | Admit: 2021-08-17 | Discharge: 2021-08-17 | Disposition: A | Discharge status: Home / Self Care | Payer: BC Managed Care – PPO | Source: Ambulatory Visit | Attending: Physician Assistant | Admitting: Physician Assistant

## 2021-08-17 DIAGNOSIS — Z0000 Encounter for general adult medical examination without abnormal findings: Secondary | ICD-10-CM

## 2021-08-17 DIAGNOSIS — Z780 Asymptomatic menopausal state: Secondary | ICD-10-CM | POA: Diagnosis not present

## 2021-08-28 ENCOUNTER — Encounter: Payer: Self-pay | Admitting: Physician Assistant

## 2021-09-12 DIAGNOSIS — M25551 Pain in right hip: Secondary | ICD-10-CM | POA: Insufficient documentation

## 2021-09-14 DIAGNOSIS — M25551 Pain in right hip: Secondary | ICD-10-CM | POA: Diagnosis not present

## 2021-09-15 ENCOUNTER — Ambulatory Visit: Payer: BC Managed Care – PPO | Admitting: Physician Assistant

## 2021-09-15 ENCOUNTER — Other Ambulatory Visit: Payer: Self-pay | Admitting: Physician Assistant

## 2021-09-15 ENCOUNTER — Encounter: Payer: Self-pay | Admitting: Physician Assistant

## 2021-09-15 VITALS — BP 153/83 | HR 59 | Temp 98.7°F | Ht 65.0 in | Wt 192.8 lb

## 2021-09-15 DIAGNOSIS — M47896 Other spondylosis, lumbar region: Secondary | ICD-10-CM | POA: Diagnosis not present

## 2021-09-15 DIAGNOSIS — I10 Essential (primary) hypertension: Secondary | ICD-10-CM | POA: Diagnosis not present

## 2021-09-15 DIAGNOSIS — M85851 Other specified disorders of bone density and structure, right thigh: Secondary | ICD-10-CM

## 2021-09-15 DIAGNOSIS — M25551 Pain in right hip: Secondary | ICD-10-CM

## 2021-09-15 NOTE — Progress Notes (Signed)
? ?Subjective:  ? ? Patient ID: Catherine Yates, female    DOB: 1961/03/15, 61 y.o.   MRN: [id number] ? ?Chief Complaint  ?Patient presents with  ? Results  ?  Pt would like to discuss Bone Density results. Pt says her Mother passed away August 01, 2022.  ? ? ?HPI ?Patient is in today for discussion about bone density and recheck on right hip pain and hypertension. ? ?DEXA scan on 08/17/21 ?Takes calcium and Vit D supplements. Plenty of calcium in diet. Pt is considered normal according to [facility] Criteria.  L4-L5 was excluded due to degenerative changes. Denies much back pain.  ? ?Orthopedic appt yesterday - [facility] - she will have cortisone injection 09/19/21, first one ever, nervous about this. Told not "if but when" surgery will have to happen. PT only minimally helped. Pain worse with activity, especially yard work.  ? ?Patient has been taking lisinopril HCTZ 20-12.5 mg daily.  Blood pressure has been doing well at home.  She has not yet taken her medication today. ? ?No other symptoms or concerns today. ? ?Past Medical History:  ?Diagnosis Date  ? Elevated cholesterol   ? Fibroid   ? Hypertension   ? ? ?Past Surgical History:  ?Procedure Laterality Date  ? BREAST BIOPSY Left   ? approx 2009  ? ENDOMETRIAL ABLATION    ? EYE SURGERY Right   ? ? ?Family History  ?Problem Relation Age of Onset  ? Breast cancer Mother   ? Hypertension Mother   ? Diabetes Mother   ? Congestive Heart Failure Mother   ? Renal Disease Mother   ? Heart Problems Father   ? Cancer Father   ? Diabetes Father   ? Hypertension Sister   ? Hyperlipidemia Sister   ? Other Sister   ?     breast hyperplasia  ? Melanoma Sister   ? Hypertension Sister   ? Hyperlipidemia Sister   ? Hypertension Sister   ? Hyperlipidemia Sister   ? Hypertension Brother   ? Diabetes Brother   ? Hyperlipidemia Brother   ? Diabetes Maternal Grandmother   ? Stroke Maternal Grandfather   ? Stomach cancer Paternal Grandfather   ? Alcoholism Paternal Grandfather   ? Breast cancer  Maternal Aunt   ? ? ?Social History  ? ?Tobacco Use  ? Smoking status: Never  ? Smokeless tobacco: Never  ?Vaping Use  ? Vaping Use: Never used  ?Substance Use Topics  ? Alcohol use: Yes  ?  Comment: socially  ? Drug use: Never  ?  ? ?No Known Allergies ? ?Review of Systems ?NEGATIVE UNLESS OTHERWISE INDICATED IN HPI ? ? ?   ?Objective:  ?  ? ?BP (!) 153/83 (BP Location: Left Arm, Patient Position: Sitting, Cuff Size: Large)   Pulse (!) 59   Temp 98.7 ?F (37.1 ?C) (Temporal)   Ht '5\' 5"'$  (1.651 m)   Wt 192 lb 12.8 oz (87.5 kg)   LMP  (LMP Unknown)   SpO2 97%   BMI 32.08 kg/m?  ? ?Wt Readings from Last 3 Encounters:  ?09/15/21 192 lb 12.8 oz (87.5 kg)  ?06/12/21 198 lb 3.2 oz (89.9 kg)  ?04/20/21 195 lb 6.1 oz (88.6 kg)  ? ? ?BP Readings from Last 3 Encounters:  ?09/15/21 (!) 153/83  ?06/12/21 128/64  ?04/20/21 (!) 163/90  ?  ? ?Physical Exam ?Constitutional:   ?   Appearance: Normal appearance.  ?Cardiovascular:  ?   Rate and Rhythm: Normal rate and regular rhythm.  ?  Pulses: Normal pulses.  ?   Heart sounds: Normal heart sounds.  ?Pulmonary:  ?   Effort: Pulmonary effort is normal.  ?   Breath sounds: Normal breath sounds.  ?Neurological:  ?   Mental Status: She is alert and oriented to person, place, and time.  ?Psychiatric:     ?   Mood and Affect: Mood normal.     ?   Behavior: Behavior normal.  ? ? ?   ?Assessment & Plan:  ? ?Problem List Items Addressed This Visit   ?None ?Visit Diagnoses   ? ? Osteopenia of right hip    -  Primary  ? Pain in right hip      ? Other osteoarthritis of spine, lumbar region      ? White coat syndrome with diagnosis of hypertension      ? ?  ? ?1. Osteopenia of right hip ?2. Pain in right hip ?Patient is to continue follow-up with [facility] and have her first cortisone injection on 09/19/2021.  She will let me know how this goes. ? ?3. Other osteoarthritis of spine, lumbar region ?Reassured patient that DEXA scan was normal and L4-L5 was excluded due to some degenerative  changes, otherwise including this area in report could skew her results. ?She will continue on calcium and vitamin D supplements.  She will continue women's multivitamin.  Weightbearing exercises encouraged. ? ?4. White coat syndrome with diagnosis of hypertension ?Encourage patient to monitor blood pressure at home.  Continue lisinopril HCTZ 20-12.5 mg daily.  Exercise as able.  Limit salt in diet. ? ? ?This note was prepared with assistance of Systems analyst. Occasional wrong-word or sound-a-like substitutions may have occurred due to the inherent limitations of voice recognition software. ? ?Return in about 6 months (around 03/18/2022) for fasting labs and recheck . ? ? ?Aronda Burford M Milton Sagona, PA-C ?

## 2021-09-19 DIAGNOSIS — M1611 Unilateral primary osteoarthritis, right hip: Secondary | ICD-10-CM | POA: Diagnosis not present

## 2021-09-28 DIAGNOSIS — M1611 Unilateral primary osteoarthritis, right hip: Secondary | ICD-10-CM | POA: Insufficient documentation

## 2021-10-05 ENCOUNTER — Other Ambulatory Visit: Payer: Self-pay | Admitting: Physician Assistant

## 2021-10-05 HISTORY — PX: CATARACT EXTRACTION: SUR2

## 2021-10-06 ENCOUNTER — Encounter: Payer: Self-pay | Admitting: Physician Assistant

## 2021-10-06 MED ORDER — LISINOPRIL 20 MG PO TABS: 20.0000 mg | ORAL_TABLET | Freq: Every day | ORAL | 0 refills | Status: DC

## 2021-10-09 NOTE — Telephone Encounter (Signed)
Pt advised of Catherine Yates recommendations via [place]

## 2021-10-09 NOTE — Telephone Encounter (Signed)
Please advise; last office note states to  Continue lisinopril HCTZ 20-12.5 mg daily.

## 2021-10-17 ENCOUNTER — Encounter: Payer: Self-pay | Admitting: Podiatry

## 2021-10-18 NOTE — Telephone Encounter (Signed)
Please advise 

## 2021-10-20 DIAGNOSIS — H2511 Age-related nuclear cataract, right eye: Secondary | ICD-10-CM | POA: Diagnosis not present

## 2021-10-26 DIAGNOSIS — M1611 Unilateral primary osteoarthritis, right hip: Secondary | ICD-10-CM | POA: Diagnosis not present

## 2021-12-12 ENCOUNTER — Encounter: Payer: Self-pay | Admitting: Gastroenterology

## 2022-01-03 ENCOUNTER — Other Ambulatory Visit: Payer: Self-pay | Admitting: Obstetrics and Gynecology

## 2022-01-03 DIAGNOSIS — Z1231 Encounter for screening mammogram for malignant neoplasm of breast: Secondary | ICD-10-CM

## 2022-01-12 ENCOUNTER — Other Ambulatory Visit: Payer: Self-pay | Admitting: Physician Assistant

## 2022-01-15 ENCOUNTER — Other Ambulatory Visit: Payer: Self-pay | Admitting: Physician Assistant

## 2022-01-16 DIAGNOSIS — D1801 Hemangioma of skin and subcutaneous tissue: Secondary | ICD-10-CM | POA: Diagnosis not present

## 2022-01-16 DIAGNOSIS — L821 Other seborrheic keratosis: Secondary | ICD-10-CM | POA: Diagnosis not present

## 2022-01-16 DIAGNOSIS — L814 Other melanin hyperpigmentation: Secondary | ICD-10-CM | POA: Diagnosis not present

## 2022-01-16 DIAGNOSIS — L578 Other skin changes due to chronic exposure to nonionizing radiation: Secondary | ICD-10-CM | POA: Diagnosis not present

## 2022-01-18 ENCOUNTER — Ambulatory Visit (AMBULATORY_SURGERY_CENTER): Payer: Self-pay

## 2022-01-18 VITALS — Ht 65.0 in | Wt 193.0 lb

## 2022-01-18 DIAGNOSIS — Z86010 Personal history of colonic polyps: Secondary | ICD-10-CM

## 2022-01-18 MED ORDER — NA SULFATE-K SULFATE-MG SULF 17.5-3.13-1.6 GM/177ML PO SOLN: 1.0000 | Freq: Once | ORAL | 0 refills | Status: AC

## 2022-01-18 NOTE — Progress Notes (Signed)
No egg or soy allergy known to patient  No issues known to pt with past sedation with any surgeries or procedures Patient denies ever being told they had issues or difficulty with intubation  No FH of Malignant Hyperthermia Pt is not on diet pills Pt is not on home 02  Pt is not on blood thinners  Pt denies issues with constipation  No A fib or A flutter Have any cardiac testing pending--NO Pt instructed to use Singlecare.com or GoodRx for a price reduction on prep   

## 2022-01-26 ENCOUNTER — Encounter: Payer: Self-pay | Admitting: Gastroenterology

## 2022-01-29 ENCOUNTER — Encounter: Payer: Self-pay | Admitting: *Deleted

## 2022-02-12 ENCOUNTER — Encounter: Payer: Self-pay | Admitting: Gastroenterology

## 2022-02-12 ENCOUNTER — Ambulatory Visit (AMBULATORY_SURGERY_CENTER): Payer: BC Managed Care – PPO | Admitting: Gastroenterology

## 2022-02-12 VITALS — BP 164/92 | HR 54 | Temp 98.0°F | Resp 13 | Ht 65.0 in | Wt 193.0 lb

## 2022-02-12 DIAGNOSIS — Z86010 Personal history of colonic polyps: Secondary | ICD-10-CM | POA: Diagnosis not present

## 2022-02-12 DIAGNOSIS — Z1211 Encounter for screening for malignant neoplasm of colon: Secondary | ICD-10-CM | POA: Diagnosis not present

## 2022-02-12 DIAGNOSIS — Z09 Encounter for follow-up examination after completed treatment for conditions other than malignant neoplasm: Secondary | ICD-10-CM | POA: Diagnosis not present

## 2022-02-12 MED ORDER — SODIUM CHLORIDE 0.9 % IV SOLN
500.0000 mL | Freq: Once | INTRAVENOUS | Status: DC
Start: 2022-02-12 — End: 2022-02-12

## 2022-02-12 NOTE — Patient Instructions (Addendum)
- High fiber diet. - Continue present medications. - Repeat colonoscopy in 10 years for surveillance, earlier with new symptoms. - Emerging evidence supports eating a diet of fruits, vegetables, grains, calcium, and yogurt while reducing red meat and alcohol may reduce the risk of colon cancer.  Handouts given for high fiber diet and diverticulosis.  There were no polyps seen today!  You will need another screening colonoscopy in 10 years, you will receive a letter at that time when you are due for the procedure.   Please call us at [phone] if you have a change in bowel habits, change in family history of colo-rectal cancer, rectal bleeding or other GI concern before that time.    YOU HAD AN ENDOSCOPIC PROCEDURE TODAY AT [facility]:   Refer to the procedure report that was given to you for any specific questions about what was found during the examination.  If the procedure report does not answer your questions, please call your gastroenterologist to clarify.  If you requested that your care partner not be given the details of your procedure findings, then the procedure report has been included in a sealed envelope for you to review at your convenience later.  YOU SHOULD EXPECT: Some feelings of bloating in the abdomen. Passage of more gas than usual.  Walking can help get rid of the air that was put into your GI tract during the procedure and reduce the bloating. If you had a lower endoscopy (such as a colonoscopy or flexible sigmoidoscopy) you may notice spotting of blood in your stool or on the toilet paper. If you underwent a bowel prep for your procedure, you may not have a normal bowel movement for a few days.  Please Note:  You might notice some irritation and congestion in your nose or some drainage.  This is from the oxygen used during your procedure.  There is no need for concern and it should clear up in a day or so.  SYMPTOMS TO REPORT IMMEDIATELY:  Following  lower endoscopy (colonoscopy or flexible sigmoidoscopy):  Excessive amounts of blood in the stool  Significant tenderness or worsening of abdominal pains  Swelling of the abdomen that is new, acute  Fever of 100F or higher  For urgent or emergent issues, a gastroenterologist can be reached at any hour by calling [phone]. Do not use MyChart messaging for urgent concerns.    DIET:  We do recommend a small meal at first, but then you may proceed to your regular diet.  Drink plenty of fluids but you should avoid alcoholic beverages for 24 hours.  ACTIVITY:  You should plan to take it easy for the rest of today and you should NOT DRIVE or use heavy machinery until tomorrow (because of the sedation medicines used during the test).    FOLLOW UP: Our staff will call the number listed on your records the next business day following your procedure.  We will call around 7:15- 8:00 am to check on you and address any questions or concerns that you may have regarding the information given to you following your procedure. If we do not reach you, we will leave a message.     Patient has a contact number available for emergencies. The signs and symptoms of potential delayed complications were discussed with the patient.   Return to normal activities tomorrow. Written discharge instructions were provided to the patient.    SIGNATURES/CONFIDENTIALITY: You and/or your care partner have signed paperwork which  will be entered into your electronic medical record.  These signatures attest to the fact that that the information above on your After Visit Summary has been reviewed and is understood.  Full responsibility of the confidentiality of this discharge information lies with you and/or your care-partner.

## 2022-02-12 NOTE — Progress Notes (Signed)
PT taken to PACU. Monitors in place. VSS. Report given to RN. 

## 2022-02-12 NOTE — Op Note (Signed)
[place] Patient Name: Catherine Yates Procedure Date: 02/12/2022 8:02 AM MRN: [id number] Endoscopist: Thornton Park MD, MD Age: 61 Referring MD:  Date of Birth: 22-Oct-1960 Gender: Female Account #: [id number] Procedure:                Colonoscopy Indications:              Screening for colorectal malignant neoplasm                           Colon polyp on colonoscopy with Dr. Collene Mares 2012                           No polyps on colonoscopy with Dr. Paulita Fujita 2018 Medicines:                Monitored Anesthesia Care Procedure:                Pre-Anesthesia Assessment:                           - Prior to the procedure, a History and Physical                            was performed, and patient medications and                            allergies were reviewed. The patient's tolerance of                            previous anesthesia was also reviewed. The risks                            and benefits of the procedure and the sedation                            options and risks were discussed with the patient.                            All questions were answered, and informed consent                            was obtained. Prior Anticoagulants: The patient has                            taken no previous anticoagulant or antiplatelet                            agents. ASA Grade Assessment: II - A patient with                            mild systemic disease. After reviewing the risks                            and benefits, the patient was deemed in  satisfactory condition to undergo the procedure.                           After obtaining informed consent, the colonoscope                            was passed under direct vision. Throughout the                            procedure, the patient's blood pressure, pulse, and                            oxygen saturations were monitored continuously. The                            CF [id number] #[id number] was  introduced through the anus                            and advanced to the 3 cm into the ileum. The                            colonoscopy was performed without difficulty. The                            patient tolerated the procedure well. The quality                            of the bowel preparation was good. The terminal                            ileum, ileocecal valve, appendiceal orifice, and                            rectum were photographed. Scope In: 8:15:00 AM Scope Out: 8:34:15 AM Scope Withdrawal Time: 0 hours 14 minutes 27 seconds  Total Procedure Duration: 0 hours 19 minutes 15 seconds  Findings:                 The perianal and digital rectal examinations were                            normal.                           Non-bleeding internal hemorrhoids were found.                           Multiple small and large-mouthed diverticula were                            found in the sigmoid colon and descending colon.                           The exam was otherwise without abnormality on  direct and retroflexion views. Complications:            No immediate complications. Estimated Blood Loss:     Estimated blood loss: none. Impression:               - Non-bleeding internal hemorrhoids.                           - Diverticulosis in the sigmoid colon and in the                            descending colon.                           - The examination was otherwise normal on direct                            and retroflexion views.                           - No specimens collected. Recommendation:           - Patient has a contact number available for                            emergencies. The signs and symptoms of potential                            delayed complications were discussed with the                            patient. Return to normal activities tomorrow.                            Written discharge instructions were provided to the                             patient.                           - High fiber diet.                           - Continue present medications.                           - Repeat colonoscopy in 10 years for surveillance,                            earlier with new symptoms.                           - Emerging evidence supports eating a diet of                            fruits, vegetables, grains, calcium, and yogurt  while reducing red meat and alcohol may reduce the                            risk of colon cancer.                           - Thank you for allowing me to be involved in your                            colon cancer prevention. Thornton Park MD, MD 02/12/2022 8:39:26 AM This report has been signed electronically.

## 2022-02-12 NOTE — Progress Notes (Signed)
VS completed by DT.  Pt's states no medical or surgical changes since previsit or office visit.  

## 2022-02-12 NOTE — Progress Notes (Signed)
Referring Provider: Allwardt, Randa Evens, PA-C Primary Care Physician:  Allwardt, Randa Evens, PA-C  Indication for Procedure:  Colon cancer Surveillance   IMPRESSION:  Need for colon cancer surveillance Appropriate candidate for monitored anesthesia care  PLAN: Colonoscopy in the [facility] today   HPI: Catherine Yates is a 61 y.o. female presents for surveillance colonoscopy.  No known family history of colon cancer or polyps. No family history of uterine/endometrial cancer, pancreatic cancer or gastric/stomach cancer.   Past Medical History:  Diagnosis Date   Allergy    seasonal   Arthritis    Cataract    R eye vitrectemy and then needed cataract   Elevated cholesterol    Fibroid    Hypertension     Past Surgical History:  Procedure Laterality Date   BREAST BIOPSY Left    approx 2009   CATARACT EXTRACTION Right 10/2021   COLONOSCOPY  2018   ENDOMETRIAL ABLATION     EYE SURGERY Right 08/2020   vitrectemy   WISDOM TOOTH EXTRACTION      Current Outpatient Medications  Medication Sig Dispense Refill   Azelastine HCl 0.15 % SOLN Place 2 sprays into both nostrils daily.     calcium carbonate (OSCAL) 1500 (600 Ca) MG TABS tablet Take by mouth 2 (two) times daily with a meal.     Coenzyme Q10 (CO Q 10 PO) Take 20 mg by mouth.     lisinopril-hydrochlorothiazide (ZESTORETIC) 20-12.5 MG tablet TAKE 1 TABLET BY MOUTH EVERY DAY 90 tablet 1   rosuvastatin (CRESTOR) 5 MG tablet TAKE 1 TABLET (5 MG TOTAL) BY MOUTH DAILY. 90 tablet 3   SYSTANE ULTRA 0.4-0.3 % SOLN SMARTSIG:1 Drop(s) In Eye(s) PRN     Azelaic Acid 15 % cream Apply to affected areas daily.     Estradiol 10 MCG TABS vaginal tablet Place one tablet vaginally qhs x 1 week, then change to 2 x a week 24 tablet 4   fluticasone (FLONASE) 50 MCG/ACT nasal spray Place into both nostrils.     lisinopril (ZESTRIL) 20 MG tablet Take 1 tablet (20 mg total) by mouth daily. 90 tablet 0   tretinoin (RETIN-A) 0.025 % cream Apply  pea-sized amount to face and neck nightly.     tretinoin (RETIN-A) 4.166 % cream 1 application in the evening to face Externally Once a day for 30 days     tretinoin microspheres (RETIN-A MICRO) 0.04 % gel Apply topically.     Current Facility-Administered Medications  Medication Dose Route Frequency Provider Last Rate Last Admin   0.9 %  sodium chloride infusion  500 mL Intravenous Once Thornton Park, MD        Allergies as of 02/12/2022   (No Known Allergies)    Family History  Problem Relation Age of Onset   Breast cancer Mother    Hypertension Mother    Diabetes Mother    Congestive Heart Failure Mother    Renal Disease Mother    Heart Problems Father    Cancer Father    Diabetes Father    Hypertension Sister    Hyperlipidemia Sister    Other Sister        breast hyperplasia   Melanoma Sister    Hypertension Sister    Hyperlipidemia Sister    Hypertension Sister    Hyperlipidemia Sister    Hypertension Brother    Diabetes Brother    Hyperlipidemia Brother    Breast cancer Maternal Aunt    Diabetes Maternal Grandmother  Stroke Maternal Grandfather    Stomach cancer Paternal Grandfather    Alcoholism Paternal Grandfather    Colon cancer Neg Hx    Colon polyps Neg Hx    Esophageal cancer Neg Hx    Rectal cancer Neg Hx      Physical Exam: General:   Alert,  well-nourished, pleasant and cooperative in NAD Head:  Normocephalic and atraumatic. Eyes:  Sclera clear, no icterus.   Conjunctiva pink. Mouth:  No deformity or lesions.   Neck:  Supple; no masses or thyromegaly. Lungs:  Clear throughout to auscultation.   No wheezes. Heart:  Regular rate and rhythm; no murmurs. Abdomen:  Soft, non-tender, nondistended, normal bowel sounds, no rebound or guarding.  Msk:  Symmetrical. No boney deformities LAD: No inguinal or umbilical LAD Extremities:  No clubbing or edema. Neurologic:  Alert and  oriented x4;  grossly nonfocal Skin:  No obvious rash or  bruise. Psych:  Alert and cooperative. Normal mood and affect.     Studies/Results: No results found.    Waylynn Benefiel L. Tarri Glenn, MD, MPH 02/12/2022, 8:06 AM

## 2022-02-13 ENCOUNTER — Telehealth: Payer: Self-pay | Admitting: *Deleted

## 2022-02-13 NOTE — Telephone Encounter (Signed)
Attempt for follow up phone call. No answer at number given.  Left message on voicemail.   

## 2022-02-19 ENCOUNTER — Ambulatory Visit: Payer: BC Managed Care – PPO

## 2022-02-20 ENCOUNTER — Encounter: Payer: Self-pay | Admitting: Physician Assistant

## 2022-02-20 NOTE — Telephone Encounter (Signed)
Please see pt msg regarding upcoming appt and request for referral to cardiology

## 2022-02-21 ENCOUNTER — Other Ambulatory Visit: Payer: Self-pay

## 2022-02-21 DIAGNOSIS — I1A0 Resistant hypertension: Secondary | ICD-10-CM

## 2022-03-08 ENCOUNTER — Telehealth: Payer: Self-pay | Admitting: Cardiology

## 2022-03-08 NOTE — Telephone Encounter (Signed)
Left  a message to patient. Office would like for to reschedule  appointment  for 2 weeks out from today  Nov 2,2023

## 2022-03-08 NOTE — Telephone Encounter (Signed)
New Message:    Patient said she tested positive for Covid on Monday(03-05-22). She said yesterday she tested negative. She have an appointment on Tuesday(03-13-22). She wants to know if it will be alright for her to come for her appointment on Tuesday?

## 2022-03-13 ENCOUNTER — Ambulatory Visit: Payer: BC Managed Care – PPO | Admitting: Cardiology

## 2022-03-20 ENCOUNTER — Ambulatory Visit (INDEPENDENT_AMBULATORY_CARE_PROVIDER_SITE_OTHER): Payer: BC Managed Care – PPO | Admitting: Physician Assistant

## 2022-03-20 ENCOUNTER — Encounter: Payer: Self-pay | Admitting: Physician Assistant

## 2022-03-20 VITALS — BP 136/79 | HR 65 | Temp 98.0°F | Ht 65.0 in | Wt 192.4 lb

## 2022-03-20 DIAGNOSIS — Z0000 Encounter for general adult medical examination without abnormal findings: Secondary | ICD-10-CM | POA: Diagnosis not present

## 2022-03-20 DIAGNOSIS — I10 Essential (primary) hypertension: Secondary | ICD-10-CM | POA: Insufficient documentation

## 2022-03-20 DIAGNOSIS — E782 Mixed hyperlipidemia: Secondary | ICD-10-CM

## 2022-03-20 DIAGNOSIS — I1A0 Resistant hypertension: Secondary | ICD-10-CM

## 2022-03-20 LAB — CBC WITH DIFFERENTIAL/PLATELET
Basophils Absolute: 0 10*3/uL (ref 0.0–0.1)
Basophils Relative: 0.7 % (ref 0.0–3.0)
Eosinophils Absolute: 0.2 10*3/uL (ref 0.0–0.7)
Eosinophils Relative: 3.6 % (ref 0.0–5.0)
HCT: 35.5 % — ABNORMAL LOW (ref 36.0–46.0)
Hemoglobin: 12 g/dL (ref 12.0–15.0)
Lymphocytes Relative: 31 % (ref 12.0–46.0)
Lymphs Abs: 1.3 10*3/uL (ref 0.7–4.0)
MCHC: 33.7 g/dL (ref 30.0–36.0)
MCV: 86.8 fl (ref 78.0–100.0)
Monocytes Absolute: 0.3 10*3/uL (ref 0.1–1.0)
Monocytes Relative: 7.8 % (ref 3.0–12.0)
Neutro Abs: 2.4 10*3/uL (ref 1.4–7.7)
Neutrophils Relative %: 56.9 % (ref 43.0–77.0)
Platelets: 236 10*3/uL (ref 150.0–400.0)
RBC: 4.09 Mil/uL (ref 3.87–5.11)
RDW: 13.5 % (ref 11.5–15.5)
WBC: 4.3 10*3/uL (ref 4.0–10.5)

## 2022-03-20 LAB — COMPREHENSIVE METABOLIC PANEL
ALT: 15 U/L (ref 0–35)
AST: 17 U/L (ref 0–37)
Albumin: 4.1 g/dL (ref 3.5–5.2)
Alkaline Phosphatase: 58 U/L (ref 39–117)
BUN: 15 mg/dL (ref 6–23)
CO2: 32 mEq/L (ref 19–32)
Calcium: 8.7 mg/dL (ref 8.4–10.5)
Chloride: 104 mEq/L (ref 96–112)
Creatinine, Ser: 0.72 mg/dL (ref 0.40–1.20)
GFR: 89.47 mL/min (ref >60.00)
Glucose, Bld: 88 mg/dL (ref 70–99)
Potassium: 3.5 mEq/L (ref 3.5–5.1)
Sodium: 141 mEq/L (ref 135–145)
Total Bilirubin: 0.7 mg/dL (ref 0.2–1.2)
Total Protein: 6.5 g/dL (ref 6.0–8.3)

## 2022-03-20 LAB — LIPID PANEL
Cholesterol: 166 mg/dL (ref 0–200)
HDL: 48.6 mg/dL (ref >39.00)
LDL Cholesterol: 93 mg/dL (ref 0–99)
NonHDL: 117.73
Total CHOL/HDL Ratio: 3
Triglycerides: 123 mg/dL (ref 0.0–149.0)
VLDL: 24.6 mg/dL (ref 0.0–40.0)

## 2022-03-20 LAB — TSH: TSH: 2.87 u[IU]/mL (ref 0.35–5.50)

## 2022-03-20 NOTE — Progress Notes (Signed)
Subjective:    Patient ID: Catherine Yates, female    DOB: Apr 18, 1961, 61 y.o.   MRN: [id number]  Chief Complaint  Patient presents with   Annual Exam    Pt had no questions or concerns, pt is fasting.     HPI Patient is in today for annual exam. No health changes since last visit.   Acute concerns: Cardiology appt this Friday - occasionally has a tight feel in left chest. Doesn't happen with exertion. Not out of breath. Wants to talk with cardiology about family heart history.   Health maintenance: Lifestyle/ exercise: Staying active, enjoys tennis & weightbearing exercises; [employer] for exercise; R hip arthritis bothers her sometimes  Nutrition: Well-balanced Mental health: Doing ok, sometimes sad (father going through cancer that has spread to his liver) Sleep: Doing well  Immunizations: UTD Colonoscopy: Due in 2033  Pap: UTD  Mammogram: Scheduled for tomorrow  DEXA: April 2023     Past Medical History:  Diagnosis Date   Allergy    seasonal   Arthritis    Cataract    R eye vitrectemy and then needed cataract   Elevated cholesterol    Fibroid    Hypertension     Past Surgical History:  Procedure Laterality Date   BREAST BIOPSY Left    approx 2009   CATARACT EXTRACTION Right 10/2021   COLONOSCOPY  2018   ENDOMETRIAL ABLATION     EYE SURGERY Right 08/2020   vitrectemy   WISDOM TOOTH EXTRACTION      Family History  Problem Relation Age of Onset   Breast cancer Mother    Hypertension Mother    Diabetes Mother    Congestive Heart Failure Mother    Renal Disease Mother    Cancer Mother    COPD Mother    Hearing loss Mother    Heart disease Mother    Kidney disease Mother    Vision loss Mother    Heart Problems Father    Cancer Father        angiosarcoma   Diabetes Father    Hearing loss Father    Heart disease Father    Hypertension Father    Hypertension Sister    Hyperlipidemia Sister    Other Sister        breast hyperplasia   Melanoma  Sister    Hypertension Sister    Hyperlipidemia Sister    Hypertension Sister    Hyperlipidemia Sister    Heart disease Sister    Hypertension Brother    Diabetes Brother    Hyperlipidemia Brother    Diabetes Maternal Grandmother    Hearing loss Maternal Grandmother    Stroke Maternal Grandfather    Stomach cancer Paternal Grandfather    Alcoholism Paternal Grandfather    Breast cancer Maternal Aunt    Cancer Maternal Aunt    Colon cancer Neg Hx    Colon polyps Neg Hx    Esophageal cancer Neg Hx    Rectal cancer Neg Hx     Social History   Tobacco Use   Smoking status: Never    Passive exposure: Past (as a child, parents)   Smokeless tobacco: Never  Vaping Use   Vaping Use: Never used  Substance Use Topics   Alcohol use: Yes    Alcohol/week: 2.0 standard drinks of alcohol    Types: 2 Glasses of wine per week    Comment: socially   Drug use: Never     No Known Allergies  Review of Systems NEGATIVE UNLESS OTHERWISE INDICATED IN HPI      Objective:     BP 136/79 (BP Location: Left Arm, Patient Position: Sitting)   Pulse 65   Temp 98 F (36.7 C) (Temporal)   Ht '5\' 5"'$  (1.651 m)   Wt 192 lb 6.4 oz (87.3 kg)   LMP  (LMP Unknown)   SpO2 96%   BMI 32.02 kg/m   Wt Readings from Last 3 Encounters:  03/20/22 192 lb 6.4 oz (87.3 kg)  02/12/22 193 lb (87.5 kg)  01/18/22 193 lb (87.5 kg)    BP Readings from Last 3 Encounters:  03/20/22 136/79  02/12/22 (!) 164/92  09/15/21 (!) 153/83     Physical Exam Vitals and nursing note reviewed.  Constitutional:      Appearance: Normal appearance. She is normal weight. She is not toxic-appearing.  HENT:     Head: Normocephalic and atraumatic.     Right Ear: Tympanic membrane, ear canal and external ear normal.     Left Ear: Tympanic membrane, ear canal and external ear normal.     Nose: Nose normal.     Mouth/Throat:     Mouth: Mucous membranes are moist.  Eyes:     Extraocular Movements: Extraocular movements  intact.     Conjunctiva/sclera: Conjunctivae normal.     Pupils: Pupils are equal, round, and reactive to light.  Cardiovascular:     Rate and Rhythm: Normal rate and regular rhythm.     Pulses: Normal pulses.     Heart sounds: Normal heart sounds.  Pulmonary:     Effort: Pulmonary effort is normal.     Breath sounds: Normal breath sounds.  Abdominal:     General: Abdomen is flat. Bowel sounds are normal.     Palpations: Abdomen is soft.  Musculoskeletal:        General: Normal range of motion.     Cervical back: Normal range of motion and neck supple.  Skin:    General: Skin is warm and dry.  Neurological:     General: No focal deficit present.     Mental Status: She is alert and oriented to person, place, and time.  Psychiatric:        Mood and Affect: Mood normal.        Behavior: Behavior normal.        Thought Content: Thought content normal.        Judgment: Judgment normal.        Assessment & Plan:  Encounter for annual physical exam Assessment & Plan: Age-appropriate screening and counseling performed today. Will check labs and call with results. Preventive measures discussed and printed in AVS for patient.   Patient Counseling: '[x]'$   Nutrition: Stressed importance of moderation in sodium/caffeine intake, saturated fat and cholesterol, caloric balance, sufficient intake of fresh fruits, vegetables, and fiber.  '[x]'$   Stressed the importance of regular exercise.   '[]'$   Substance Abuse: Discussed cessation/primary prevention of tobacco, alcohol, or other drug use; driving or other dangerous activities under the influence; availability of treatment for abuse.   '[x]'$   Injury prevention: Discussed safety belts, safety helmets, smoke detector, smoking near bedding or upholstery.   '[]'$   Sexuality: Discussed sexually transmitted diseases, partner selection, use of condoms, avoidance of unintended pregnancy  and contraceptive alternatives.   '[x]'$   Dental health: Discussed importance  of regular tooth brushing, flossing, and dental visits.  '[x]'$   Health maintenance and immunizations reviewed. Please refer to Health maintenance section.  Orders: -     CBC with Differential/Platelet -     Comprehensive metabolic panel -     Lipid panel -     TSH  Mixed hyperlipidemia Assessment & Plan: Recheck lipid panel Adjust Crestor 5 mg daily pending labs  Keep up good work with lifestyle  Orders: -     Lipid panel  Resistant hypertension Assessment & Plan: Good reading today Pt currently taking lisinopril-HCTZ 20-12.5 mg daily Monitor at home Cardiology appt this week        Return in about 1 year (around 03/21/2023) for CPE, labs .    Malania Gawthrop M Teresa Lemmerman, PA-C

## 2022-03-20 NOTE — Assessment & Plan Note (Signed)
Good reading today Pt currently taking lisinopril-HCTZ 20-12.5 mg daily Monitor at home Cardiology appt this week

## 2022-03-20 NOTE — Assessment & Plan Note (Signed)
Age-appropriate screening and counseling performed today. Will check labs and call with results. Preventive measures discussed and printed in AVS for patient.   Patient Counseling: '[x]'$   Nutrition: Stressed importance of moderation in sodium/caffeine intake, saturated fat and cholesterol, caloric balance, sufficient intake of fresh fruits, vegetables, and fiber.  '[x]'$   Stressed the importance of regular exercise.   '[]'$   Substance Abuse: Discussed cessation/primary prevention of tobacco, alcohol, or other drug use; driving or other dangerous activities under the influence; availability of treatment for abuse.   '[x]'$   Injury prevention: Discussed safety belts, safety helmets, smoke detector, smoking near bedding or upholstery.   '[]'$   Sexuality: Discussed sexually transmitted diseases, partner selection, use of condoms, avoidance of unintended pregnancy  and contraceptive alternatives.   '[x]'$   Dental health: Discussed importance of regular tooth brushing, flossing, and dental visits.  '[x]'$   Health maintenance and immunizations reviewed. Please refer to Health maintenance section.

## 2022-03-20 NOTE — Assessment & Plan Note (Signed)
Recheck lipid panel Adjust Crestor 5 mg daily pending labs  Keep up good work with lifestyle

## 2022-03-21 ENCOUNTER — Ambulatory Visit
Admission: RE | Admit: 2022-03-21 | Discharge: 2022-03-21 | Disposition: A | Discharge status: Home / Self Care | Payer: BC Managed Care – PPO | Source: Ambulatory Visit | Attending: Obstetrics and Gynecology | Admitting: Obstetrics and Gynecology

## 2022-03-21 ENCOUNTER — Ambulatory Visit: Payer: BC Managed Care – PPO | Admitting: Cardiology

## 2022-03-21 DIAGNOSIS — Z1231 Encounter for screening mammogram for malignant neoplasm of breast: Secondary | ICD-10-CM

## 2022-03-22 NOTE — Progress Notes (Signed)
Cardiology Office Note   Date:  03/23/2022   ID:  Catherine Yates, DOB 01/25/1961, MRN [id number]  PCP:  Fredirick Lathe, PA-C  Cardiologist:   None Referring:  Allwardt, Randa Evens, PA-C  Chief Complaint  Patient presents with   Chest Pain      History of Present Illness: Catherine Yates is a 61 y.o. female who presents for evaluation of increased cardiovascular risk.  She has a strong family history of early heart disease.  She herself has not had any prior cardiovascular testing.  She does get some discomfort under her left breast.  This is a sharp intermittent pain.  However, she can do such things as play tennis and walk for exercise and she does not bring on any substernal chest discomfort.  She does not report any neck or arm discomfort.  She has no new shortness of breath, PND or orthopnea.  She has had no weight gain or edema.  She has had no palpitations, presyncope or syncope.   Past Medical History:  Diagnosis Date   Allergy    seasonal   Arthritis    Cataract    R eye vitrectemy and then needed cataract   Elevated cholesterol    Fibroid    Hypertension     Past Surgical History:  Procedure Laterality Date   BREAST BIOPSY Left    approx 2009   CATARACT EXTRACTION Right 10/2021   COLONOSCOPY  2018   ENDOMETRIAL ABLATION     EYE SURGERY Right 08/2020   vitrectemy   WISDOM TOOTH EXTRACTION       Current Outpatient Medications  Medication Sig Dispense Refill   Azelaic Acid 15 % cream Apply to affected areas daily.     Azelastine HCl 0.15 % SOLN Place 2 sprays into both nostrils daily.     calcium carbonate (OSCAL) 1500 (600 Ca) MG TABS tablet Take by mouth 2 (two) times daily with a meal.     Coenzyme Q10 (CO Q 10 PO) Take 20 mg by mouth.     fluticasone (FLONASE) 50 MCG/ACT nasal spray Place into both nostrils.     lisinopril-hydrochlorothiazide (ZESTORETIC) 20-12.5 MG tablet TAKE 1 TABLET BY MOUTH EVERY DAY 90 tablet 1   Multiple  Vitamins-Minerals (MULTIVITAMIN WITH MINERALS) tablet Take 1 tablet by mouth daily.     rosuvastatin (CRESTOR) 5 MG tablet TAKE 1 TABLET (5 MG TOTAL) BY MOUTH DAILY. 90 tablet 3   SYSTANE ULTRA 0.4-0.3 % SOLN SMARTSIG:1 Drop(s) In Eye(s) PRN     No current facility-administered medications for this visit.    Allergies:   Patient has no known allergies.    Social History:  The patient  reports that she has never smoked. She has been exposed to tobacco smoke. She has never used smokeless tobacco. She reports current alcohol use of about 2.0 standard drinks of alcohol per week. She reports that she does not use drugs.   Family History:  The patient's family history includes Alcoholism in her paternal grandfather; Breast cancer in her maternal aunt and mother; COPD in her mother; Cancer in her father, maternal aunt, and mother; Congestive Heart Failure in her mother; Diabetes in her brother, father, maternal grandmother, and mother; Hearing loss in her father, maternal grandmother, and mother; Heart Problems in her father; Heart disease in her father, mother, and sister; Hyperlipidemia in her brother, sister, sister, and sister; Hypertension in her brother, father, mother, sister, sister, and sister; Kidney disease in her  mother; Melanoma in her sister; Other in her sister; Renal Disease in her mother; Stomach cancer in her paternal grandfather; Stroke in her maternal grandfather; Vision loss in her mother.    ROS:  Please see the history of present illness.   Otherwise, review of systems are positive for none.   All other systems are reviewed and negative.    PHYSICAL EXAM: VS:  BP 130/88   Pulse 67   Ht '5\' 5"'$  (1.651 m)   Wt 195 lb 3.2 oz (88.5 kg)   LMP  (LMP Unknown)   SpO2 96%   BMI 32.48 kg/m  , BMI Body mass index is 32.48 kg/m. GENERAL:  Well appearing HEENT:  Pupils equal round and reactive, fundi not visualized, oral mucosa unremarkable NECK:  No jugular venous distention, waveform  within normal limits, carotid upstroke brisk and symmetric, no bruits, no thyromegaly LYMPHATICS:  No cervical, inguinal adenopathy LUNGS:  Clear to auscultation bilaterally BACK:  No CVA tenderness CHEST:  Unremarkable HEART:  PMI not displaced or sustained,S1 and S2 within normal limits, no S3, no S4, no clicks, no rubs, no murmurs ABD:  Flat, positive bowel sounds normal in frequency in pitch, no bruits, no rebound, no guarding, no midline pulsatile mass, no hepatomegaly, no splenomegaly EXT:  2 plus pulses throughout, no edema, no cyanosis no clubbing SKIN:  No rashes no nodules NEURO:  Cranial nerves II through XII grossly intact, motor grossly intact throughout PSYCH:  Cognitively intact, oriented to person place and time    EKG:  EKG is ordered today. The ekg ordered today demonstrates sinus rhythm, rate 67, poor anterior R wave progression, nonspecific inferior Q waves.   Recent Labs: 03/20/2022: ALT 15; BUN 15; Creatinine, Ser 0.72; Hemoglobin 12.0; Platelets 236.0; Potassium 3.5; Sodium 141; TSH 2.87    Lipid Panel    Component Value Date/Time   CHOL 166 03/20/2022 0831   TRIG 123.0 03/20/2022 0831   HDL 48.60 03/20/2022 0831   CHOLHDL 3 03/20/2022 0831   VLDL 24.6 03/20/2022 0831   LDLCALC 93 03/20/2022 0831      Wt Readings from Last 3 Encounters:  03/23/22 195 lb 3.2 oz (88.5 kg)  03/20/22 192 lb 6.4 oz (87.3 kg)  02/12/22 193 lb (87.5 kg)      Other studies Reviewed: Additional studies/ records that were reviewed today include: Labs. Review of the above records demonstrates:  Please see elsewhere in the note.     ASSESSMENT AND PLAN:  Abnormal EKG: The patient has some mild nonspecific findings on her EKG.  These are not diagnostic and in the absence of any confirmatory history do not indicate heart disease.  She should continue with primary risk reduction as below.    Chest pain: This is nonanginal.  However, I think screening with a calcium score  because of her family history is reasonable.  Goals of therapy will be based on this.  Dyslipidemia: LDL was 93 and HDL 48.  Goals of therapy will be based on the results of calcium score.  I would suggest in the future to get an LP(a) drawn.  Hypertension: Blood pressure is well controlled.  She will continue the meds as listed.  She can continue the meds as listed.   Current medicines are reviewed at length with the patient today.  The patient does not have concerns regarding medicines.  The following changes have been made:  no change  Labs/ tests ordered today include:   Orders Placed This Encounter  Procedures   CT CARDIAC SCORING (SELF PAY ONLY)   EKG 12-Lead     Disposition:   FU with me as needed.     Signed, Minus Breeding, MD  03/23/2022 10:05 AM    [place]

## 2022-03-23 ENCOUNTER — Ambulatory Visit: Payer: BC Managed Care – PPO | Attending: Cardiology | Admitting: Cardiology

## 2022-03-23 ENCOUNTER — Encounter: Payer: Self-pay | Admitting: Cardiology

## 2022-03-23 VITALS — BP 130/88 | HR 67 | Ht 65.0 in | Wt 195.2 lb

## 2022-03-23 DIAGNOSIS — R072 Precordial pain: Secondary | ICD-10-CM | POA: Diagnosis not present

## 2022-03-23 NOTE — Patient Instructions (Addendum)
Medication Instructions:  Your physician recommends that you continue on your current medications as directed. Please refer to the Current Medication list given to you today.   *If you need a refill on your cardiac medications before your next appointment, please call your pharmacy*   Lab Work: NONE ordered at this time of appointment    If you have labs (blood work) drawn today and your tests are completely normal, you will receive your results only by: [place] (if you have MyChart) OR A paper copy in the mail If you have any lab test that is abnormal or we need to change your treatment, we will call you to review the results.   Testing/Procedures: Calcium Score    Follow-Up: At [facility], you and your health needs are our priority.  As part of our continuing mission to provide you with exceptional heart care, we have created designated Provider Care Teams.  These Care Teams include your primary Cardiologist (physician) and Advanced Practice Providers (APPs -  Physician Assistants and Nurse Practitioners) who all work together to provide you with the care you need, when you need it.  We recommend signing up for the patient portal called "MyChart".  Sign up information is provided on this After Visit Summary.  MyChart is used to connect with patients for Virtual Visits (Telemedicine).  Patients are able to view lab/test results, encounter notes, upcoming appointments, etc.  Non-urgent messages can be sent to your provider as well.   To learn more about what you can do with MyChart, go to [email or web address].    Your next appointment:    As needed   The format for your next appointment:   In Person  Provider:   Dr. Percival Spanish    Other Instructions Discuss [facility] lab work when you see your PCP.   Important Information About Sugar

## 2022-04-03 ENCOUNTER — Ambulatory Visit (HOSPITAL_BASED_OUTPATIENT_CLINIC_OR_DEPARTMENT_OTHER): Payer: BC Managed Care – PPO

## 2022-04-12 ENCOUNTER — Ambulatory Visit (HOSPITAL_BASED_OUTPATIENT_CLINIC_OR_DEPARTMENT_OTHER)
Admission: RE | Admit: 2022-04-12 | Discharge: 2022-04-12 | Disposition: A | Discharge status: Home / Self Care | Payer: BC Managed Care – PPO | Source: Ambulatory Visit | Attending: Cardiology | Admitting: Cardiology

## 2022-04-12 DIAGNOSIS — R072 Precordial pain: Secondary | ICD-10-CM | POA: Insufficient documentation

## 2022-04-24 DIAGNOSIS — K130 Diseases of lips: Secondary | ICD-10-CM | POA: Diagnosis not present

## 2022-04-24 DIAGNOSIS — L570 Actinic keratosis: Secondary | ICD-10-CM | POA: Diagnosis not present

## 2022-06-07 NOTE — Progress Notes (Signed)
62 y.o. [place] Married White or Caucasian Not Hispanic or Latino female here for annual exam.  No vaginal bleeding. Sexually active, no significant discomfort.   No bowel or bladder c/o.    No LMP recorded (lmp unknown). Patient is postmenopausal.          Sexually active: Yes.    The current method of family planning is post menopausal status.    Exercising: Yes.    Walking,tennis  Smoker:  no  Health Maintenance: Pap:  06/08/20 WNL Hr hpv Neg  History of abnormal Pap:  no MMG:  03/23/22 density C Bi-rads 1 neg  BMD:   08/17/21 normal  Colonoscopy: 02/12/22 F/u 10 years  TDaP:  06/12/21 Gardasil: n/a   reports that she has never smoked. She has been exposed to tobacco smoke. She has never used smokeless tobacco. She reports current alcohol use of about 2.0 standard drinks of alcohol per week. She reports that she does not use drugs.  She is a Music therapist at [employer], working part time. Husband is retired.    Past Medical History:  Diagnosis Date   Allergy    seasonal   Arthritis    Cataract    R eye vitrectemy and then needed cataract   Elevated cholesterol    Encounter for annual physical exam 06/08/2020   Fibroid    Hypertension     Past Surgical History:  Procedure Laterality Date   BREAST BIOPSY Left    approx 2009   CATARACT EXTRACTION Right 10/2021   COLONOSCOPY  2018   ENDOMETRIAL ABLATION     EYE SURGERY Right 08/2020   vitrectemy   WISDOM TOOTH EXTRACTION      Current Outpatient Medications  Medication Sig Dispense Refill   Azelaic Acid 15 % cream Apply to affected areas daily.     calcium carbonate (OSCAL) 1500 (600 Ca) MG TABS tablet Take by mouth 2 (two) times daily with a meal.     Coenzyme Q10 (CO Q 10 PO) Take 20 mg by mouth.     fluticasone (FLONASE) 50 MCG/ACT nasal spray Place into both nostrils.     lisinopril-hydrochlorothiazide (ZESTORETIC) 20-12.5 MG tablet TAKE 1 TABLET BY MOUTH EVERY DAY 90 tablet 1   Multiple Vitamins-Minerals  (MULTIVITAMIN WITH MINERALS) tablet Take 1 tablet by mouth daily.     rosuvastatin (CRESTOR) 5 MG tablet TAKE 1 TABLET (5 MG TOTAL) BY MOUTH DAILY. 90 tablet 3   SYSTANE ULTRA 0.4-0.3 % SOLN SMARTSIG:1 Drop(s) In Eye(s) PRN     No current facility-administered medications for this visit.    Family History  Problem Relation Age of Onset   Breast cancer Mother    Hypertension Mother    Diabetes Mother    Congestive Heart Failure Mother    Renal Disease Mother    Cancer Mother    COPD Mother    Hearing loss Mother    Heart disease Mother        No prior heart attack   Kidney disease Mother    Vision loss Mother    Heart Problems Father    Cancer Father        angiosarcoma   Diabetes Father    Hearing loss Father    Heart disease Father        Pacemaker   Hypertension Father    Hypertension Sister    Hyperlipidemia Sister    Other Sister        breast hyperplasia   Melanoma Sister  Hypertension Sister    Hyperlipidemia Sister    Hypertension Sister    Hyperlipidemia Sister    Heart disease Sister        Arrhythmia   Hypertension Brother    Diabetes Brother    Hyperlipidemia Brother    Diabetes Maternal Grandmother    Hearing loss Maternal Grandmother    Stroke Maternal Grandfather    Stomach cancer Paternal Grandfather    Alcoholism Paternal Grandfather    Breast cancer Maternal Aunt    Cancer Maternal Aunt    Colon cancer Neg Hx    Colon polyps Neg Hx    Esophageal cancer Neg Hx    Rectal cancer Neg Hx   Both parents died last year.   Review of Systems  Constitutional:  Positive for activity change.  All other systems reviewed and are negative.   Exam:   Ht 5' 5"$  (1.651 m)   Wt 193 lb (87.5 kg)   LMP  (LMP Unknown)   BMI 32.12 kg/m   Weight change: @WEIGHTCHANGE$ @ Height:   Height: 5' 5"$  (165.1 cm)  Ht Readings from Last 3 Encounters:  06/15/22 5' 5"$  (1.651 m)  03/23/22 5' 5"$  (1.651 m)  03/20/22 5' 5"$  (1.651 m)    General appearance: alert,  cooperative and appears stated age Head: Normocephalic, without obvious abnormality, atraumatic Neck: no adenopathy, supple, symmetrical, trachea midline and thyroid normal to inspection and palpation Lungs: clear to auscultation bilaterally Cardiovascular: regular rate and rhythm Breasts: normal appearance, no masses or tenderness Abdomen: soft, non-tender; non distended,  no masses,  no organomegaly Extremities: extremities normal, atraumatic, no cyanosis or edema Skin: Skin color, texture, turgor normal. No rashes or lesions Lymph nodes: Cervical, supraclavicular, and axillary nodes normal. No abnormal inguinal nodes palpated Neurologic: Grossly normal   Pelvic: External genitalia:  no lesions              Urethra:  normal appearing urethra with no masses, tenderness or lesions              Bartholins and Skenes: normal                 Vagina: normal appearing vagina with normal color and discharge, no lesions              Cervix: no lesions               Bimanual Exam:  Uterus:   no masses or tenderness              Adnexa: no mass, fullness, tenderness               Rectovaginal: Confirms               Anus:  normal sphincter tone, no lesions  Kimalexis, RMA chaperoned for the exam.  1. Well woman exam Discussed breast self exam Discussed calcium and vit D intake Mammogram, pap, colonoscopy, dexa and labs are UTD

## 2022-06-14 ENCOUNTER — Ambulatory Visit: Payer: BC Managed Care – PPO | Admitting: Obstetrics and Gynecology

## 2022-06-15 ENCOUNTER — Encounter: Payer: Self-pay | Admitting: Obstetrics and Gynecology

## 2022-06-15 ENCOUNTER — Ambulatory Visit (INDEPENDENT_AMBULATORY_CARE_PROVIDER_SITE_OTHER): Payer: BC Managed Care – PPO | Admitting: Obstetrics and Gynecology

## 2022-06-15 VITALS — BP 124/72 | Ht 65.0 in | Wt 193.0 lb

## 2022-06-15 DIAGNOSIS — Z01419 Encounter for gynecological examination (general) (routine) without abnormal findings: Secondary | ICD-10-CM | POA: Diagnosis not present

## 2022-06-15 NOTE — Patient Instructions (Signed)

## 2022-07-07 ENCOUNTER — Other Ambulatory Visit: Payer: Self-pay | Admitting: Physician Assistant

## 2022-07-20 ENCOUNTER — Ambulatory Visit: Payer: BC Managed Care – PPO | Admitting: Podiatry

## 2022-07-20 ENCOUNTER — Ambulatory Visit (INDEPENDENT_AMBULATORY_CARE_PROVIDER_SITE_OTHER): Payer: BC Managed Care – PPO

## 2022-07-20 DIAGNOSIS — M779 Enthesopathy, unspecified: Secondary | ICD-10-CM | POA: Diagnosis not present

## 2022-07-20 DIAGNOSIS — M7661 Achilles tendinitis, right leg: Secondary | ICD-10-CM | POA: Diagnosis not present

## 2022-07-20 MED ORDER — DICLOFENAC SODIUM 75 MG PO TBEC: 75.0000 mg | DELAYED_RELEASE_TABLET | Freq: Two times a day (BID) | ORAL | 2 refills | Status: DC

## 2022-07-20 MED ORDER — NITROGLYCERIN 0.2 MG/HR TD PT24: 0.2000 mg | MEDICATED_PATCH | Freq: Every day | TRANSDERMAL | 12 refills | Status: DC

## 2022-07-20 NOTE — Patient Instructions (Signed)

## 2022-07-20 NOTE — Progress Notes (Signed)
Subjective:   Patient ID: Catherine Yates, female   DOB: 62 y.o.   MRN: [id number]   HPI Patient presents stating she is getting a lot of pain in her Achilles tendon right) going on around 3 months.  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  I did dispense air fracture walker properly fitted to her lower leg that is going to completely immobilize the posterior heel and placed on nitro patches and oral anti-inflammatory to begin ice treatment in about 2 weeks stretch and I did explain that we may need to get an MRI of this depending on the response.  Patient will be seen back to recheck 4 weeks or earlier if necessary  X-rays indicate no signs of bone spur or calcification associated with the tendon injury

## 2022-08-15 ENCOUNTER — Other Ambulatory Visit: Payer: Self-pay | Admitting: Physician Assistant

## 2022-08-17 ENCOUNTER — Ambulatory Visit: Payer: BC Managed Care – PPO | Admitting: Podiatry

## 2022-08-17 ENCOUNTER — Encounter: Payer: Self-pay | Admitting: Podiatry

## 2022-08-17 DIAGNOSIS — M7661 Achilles tendinitis, right leg: Secondary | ICD-10-CM | POA: Diagnosis not present

## 2022-08-19 NOTE — Progress Notes (Signed)
Subjective:   Patient ID: Catherine Yates, female   DOB: 62 y.o.   MRN: [id number]   HPI Patient states it is improved still hurts some but she has to wear the boot and is not doing well without it   ROS      Objective:  Physical Exam  Neurovascular status intact with exquisite discomfort in the Achilles tendon right at the muscle tendon junction with moderate swelling in the area with patient having reduced inflammation still some slight nodular formation no indication of tendon disruption     Assessment:  Significant strain of the muscle tendon junction right Achilles tendon with pain     Plan:  H&P reviewed condition recommended continued immobilization utilization of Nitropatch ice therapy and heel lift.  If symptoms persist will need to get MRI and educated her on this and hopefully this will not require any form of surgical intervention

## 2022-10-01 ENCOUNTER — Other Ambulatory Visit: Payer: Self-pay | Admitting: Podiatry

## 2022-12-15 ENCOUNTER — Other Ambulatory Visit: Payer: Self-pay | Admitting: Podiatry

## 2023-01-06 ENCOUNTER — Other Ambulatory Visit: Payer: Self-pay | Admitting: Physician Assistant

## 2023-01-31 ENCOUNTER — Other Ambulatory Visit (HOSPITAL_BASED_OUTPATIENT_CLINIC_OR_DEPARTMENT_OTHER): Payer: Self-pay

## 2023-01-31 MED ORDER — COVID-19 MRNA VAC-TRIS(PFIZER) 30 MCG/0.3ML IM SUSY
0.3000 mL | PREFILLED_SYRINGE | Freq: Once | INTRAMUSCULAR | 0 refills | Status: AC
  Filled 2023-01-31: qty 0.3, 1d supply, fill #0

## 2023-01-31 MED ORDER — INFLUENZA VIRUS VACC SPLIT PF (FLUZONE) 0.5 ML IM SUSY
0.5000 mL | PREFILLED_SYRINGE | Freq: Once | INTRAMUSCULAR | 0 refills | Status: AC
  Filled 2023-01-31: qty 0.5, 1d supply, fill #0

## 2023-02-08 DIAGNOSIS — M1611 Unilateral primary osteoarthritis, right hip: Secondary | ICD-10-CM | POA: Diagnosis not present

## 2023-02-14 ENCOUNTER — Other Ambulatory Visit (HOSPITAL_BASED_OUTPATIENT_CLINIC_OR_DEPARTMENT_OTHER): Payer: Self-pay | Admitting: Physician Assistant

## 2023-02-14 DIAGNOSIS — Z1231 Encounter for screening mammogram for malignant neoplasm of breast: Secondary | ICD-10-CM

## 2023-02-23 ENCOUNTER — Other Ambulatory Visit: Payer: Self-pay | Admitting: Podiatry

## 2023-03-22 ENCOUNTER — Encounter: Payer: BC Managed Care – PPO | Admitting: Physician Assistant

## 2023-03-25 ENCOUNTER — Ambulatory Visit
Admission: RE | Admit: 2023-03-25 | Discharge: 2023-03-25 | Disposition: A | Discharge status: Home / Self Care | Payer: BC Managed Care – PPO | Source: Ambulatory Visit | Attending: Physician Assistant | Admitting: Physician Assistant

## 2023-03-25 DIAGNOSIS — Z1231 Encounter for screening mammogram for malignant neoplasm of breast: Secondary | ICD-10-CM

## 2023-03-27 ENCOUNTER — Encounter: Payer: BC Managed Care – PPO | Admitting: Physician Assistant

## 2023-03-27 DIAGNOSIS — Z0189 Encounter for other specified special examinations: Secondary | ICD-10-CM | POA: Diagnosis not present

## 2023-03-31 IMAGING — MG MM DIGITAL SCREENING BILAT W/ TOMO AND CAD
8 series · 8 of 24 positions shown · non-contrast
Comparison: Previous exam(s).

CLINICAL DATA: Screening.

EXAM:
DIGITAL SCREENING BILATERAL MAMMOGRAM WITH TOMOSYNTHESIS AND CAD
TECHNIQUE: Bilateral screening digital craniocaudal and mediolateral oblique
mammograms were obtained. Bilateral screening digital breast
tomosynthesis was performed. The images were evaluated with
computer-aided detection.

[L MLO synth-2D]
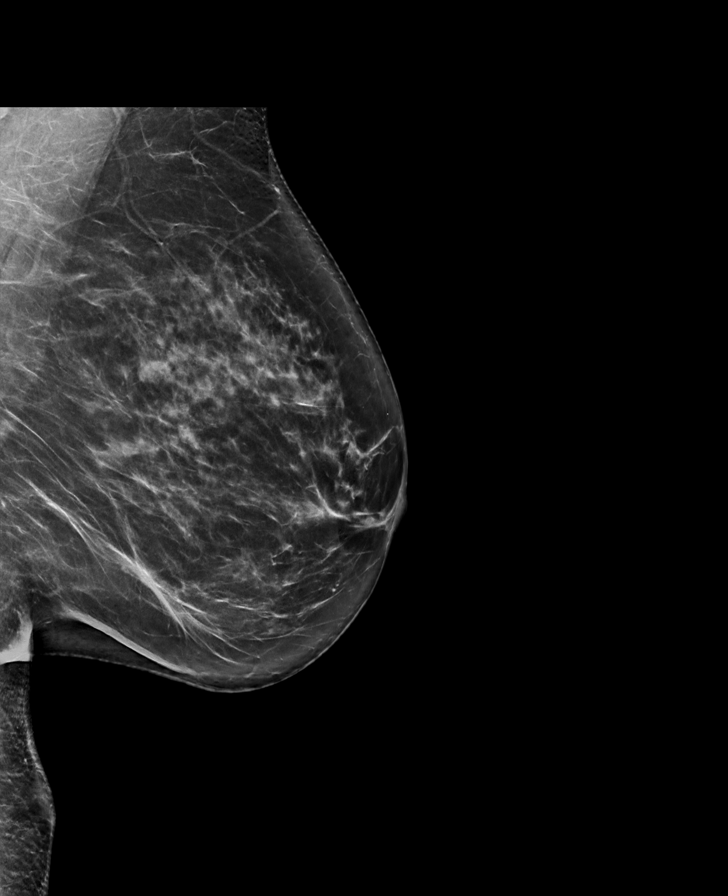

[R MLO synth-2D]
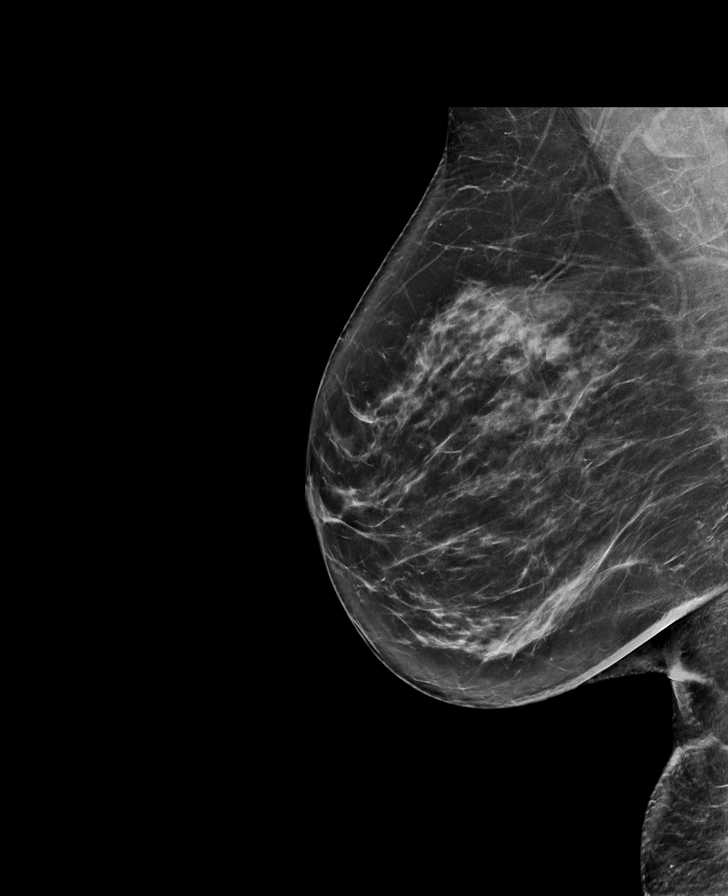

[R CC synth-2D]
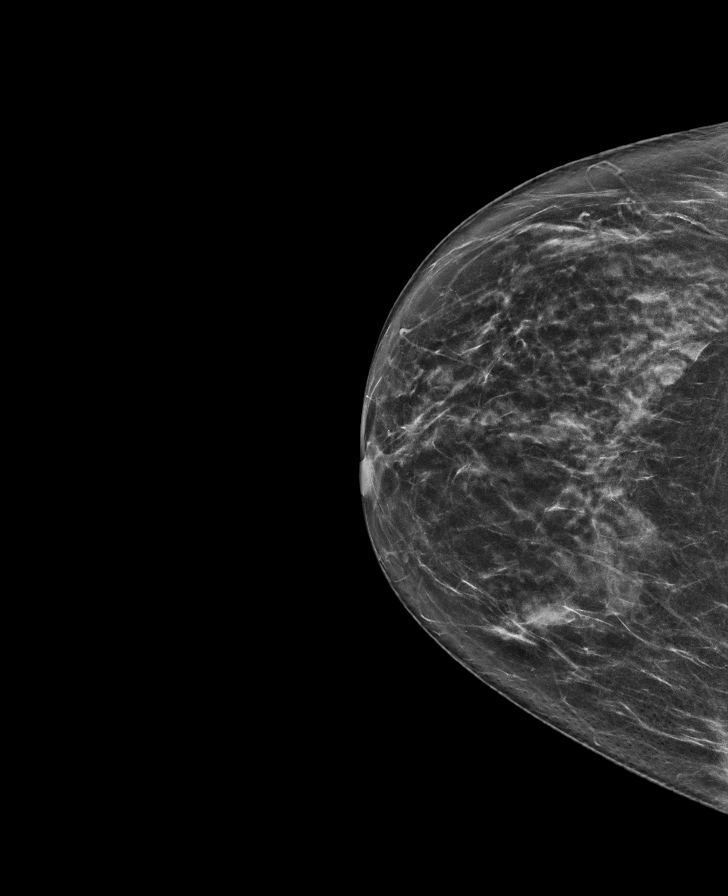

[L CC synth-2D]
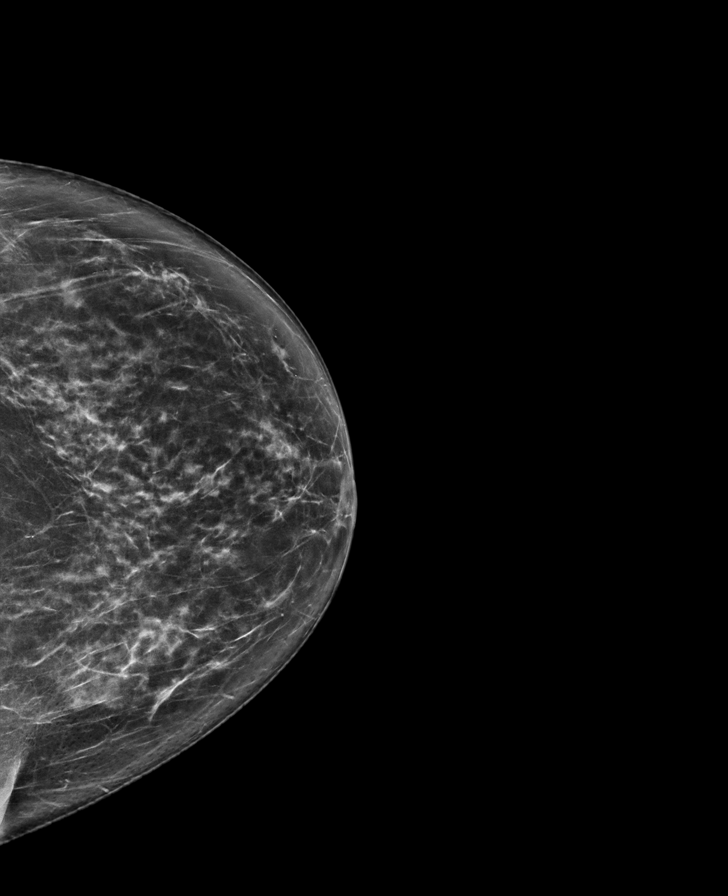

[L CC tomo · tomo slice 38/75.0]
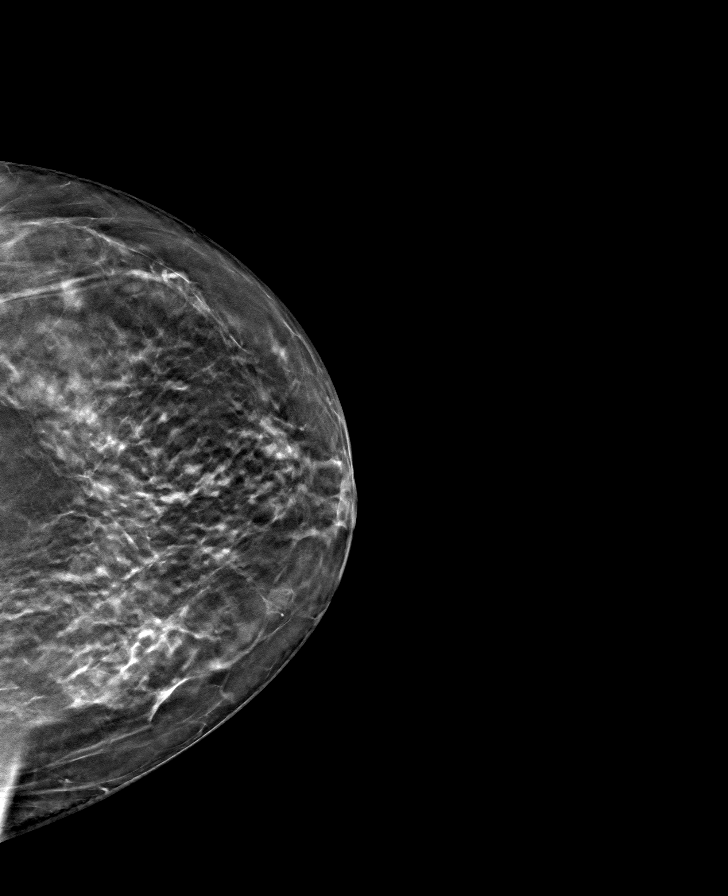

[L MLO tomo · tomo slice 43/85.0]
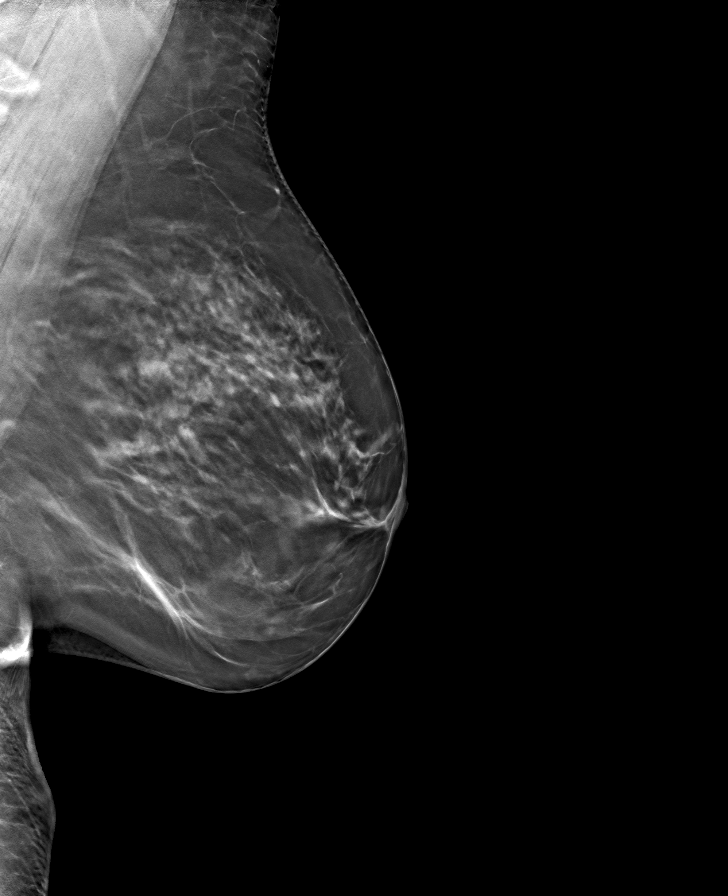

[R CC tomo · tomo slice 37/73.0]
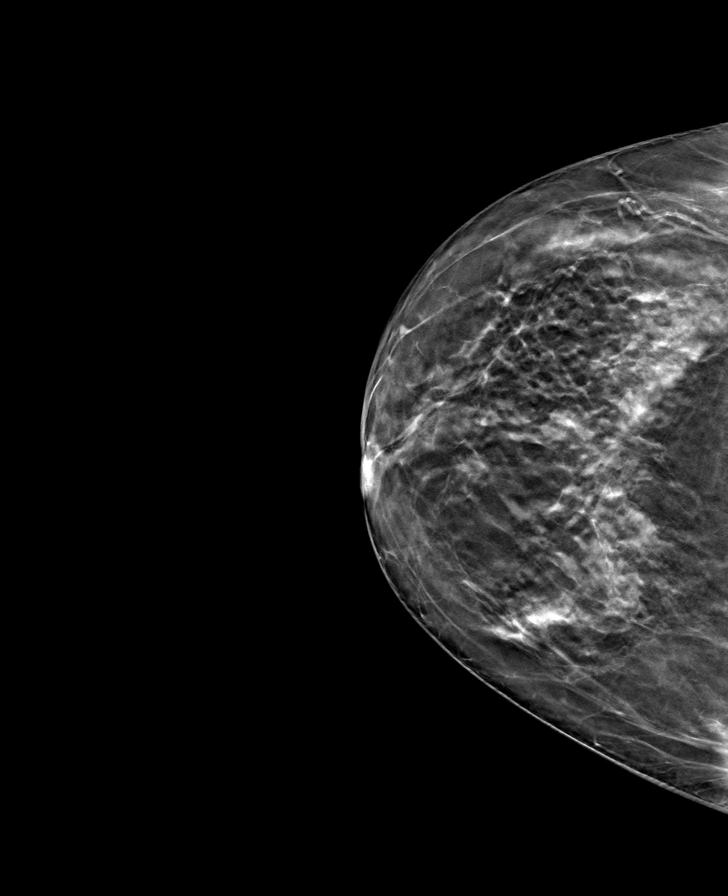

[R MLO tomo · tomo slice 43/86.0]
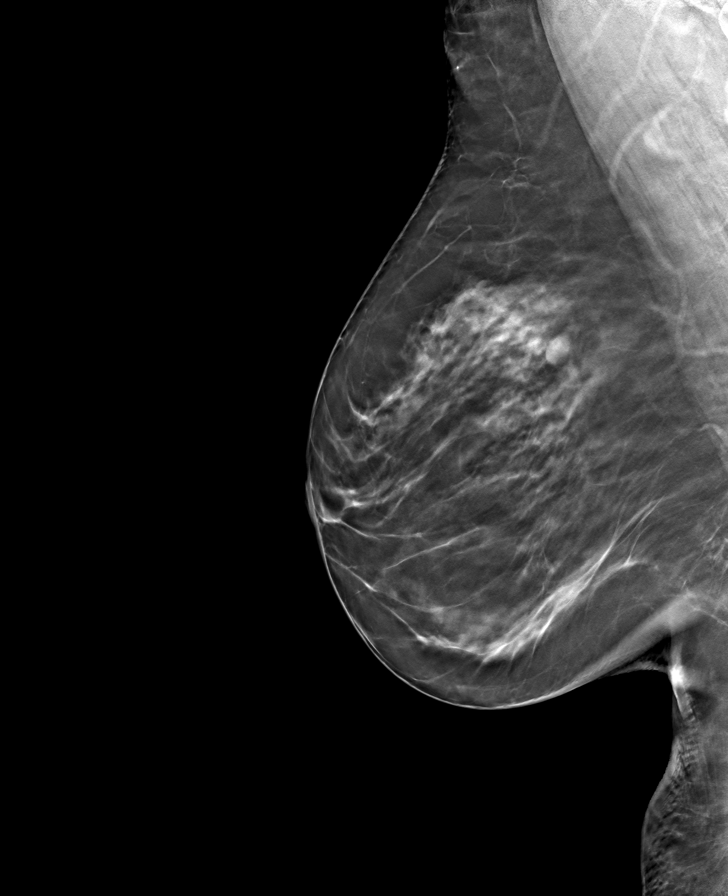

[8 of 24 positions shown; findings below may reference images not displayed]

ACR Breast Density Category c: The breast tissue is heterogeneously
dense, which may obscure small masses.
FINDINGS: There are no findings suspicious for malignancy.
IMPRESSION: No mammographic evidence of malignancy. A result letter of this
screening mammogram will be mailed directly to the patient.

RECOMMENDATION:
Screening mammogram in one year. (Code:Q3-[id number])

BI-RADS CATEGORY  1: Negative.

## 2023-04-02 DIAGNOSIS — D229 Melanocytic nevi, unspecified: Secondary | ICD-10-CM | POA: Diagnosis not present

## 2023-04-02 DIAGNOSIS — L821 Other seborrheic keratosis: Secondary | ICD-10-CM | POA: Diagnosis not present

## 2023-04-02 DIAGNOSIS — L814 Other melanin hyperpigmentation: Secondary | ICD-10-CM | POA: Diagnosis not present

## 2023-04-02 DIAGNOSIS — L578 Other skin changes due to chronic exposure to nonionizing radiation: Secondary | ICD-10-CM | POA: Diagnosis not present

## 2023-04-05 ENCOUNTER — Other Ambulatory Visit: Payer: Self-pay | Admitting: Physician Assistant

## 2023-04-09 ENCOUNTER — Encounter: Payer: Self-pay | Admitting: Physician Assistant

## 2023-04-09 ENCOUNTER — Ambulatory Visit (INDEPENDENT_AMBULATORY_CARE_PROVIDER_SITE_OTHER): Payer: BC Managed Care – PPO | Admitting: Physician Assistant

## 2023-04-09 VITALS — BP 148/78 | HR 59 | Temp 98.4°F | Ht 65.75 in | Wt 193.8 lb

## 2023-04-09 DIAGNOSIS — M1611 Unilateral primary osteoarthritis, right hip: Secondary | ICD-10-CM

## 2023-04-09 DIAGNOSIS — Z01818 Encounter for other preprocedural examination: Secondary | ICD-10-CM

## 2023-04-09 DIAGNOSIS — I10 Essential (primary) hypertension: Secondary | ICD-10-CM

## 2023-04-09 DIAGNOSIS — E782 Mixed hyperlipidemia: Secondary | ICD-10-CM | POA: Diagnosis not present

## 2023-04-09 DIAGNOSIS — Z0000 Encounter for general adult medical examination without abnormal findings: Secondary | ICD-10-CM | POA: Diagnosis not present

## 2023-04-09 LAB — COMPREHENSIVE METABOLIC PANEL
ALT: 65 U/L — ABNORMAL HIGH (ref 0–35)
AST: 41 U/L — ABNORMAL HIGH (ref 0–37)
Albumin: 4.5 g/dL (ref 3.5–5.2)
Alkaline Phosphatase: 69 U/L (ref 39–117)
BUN: 15 mg/dL (ref 6–23)
CO2: 30 meq/L (ref 19–32)
Calcium: 9.7 mg/dL (ref 8.4–10.5)
Chloride: 104 meq/L (ref 96–112)
Creatinine, Ser: 0.75 mg/dL (ref 0.40–1.20)
GFR: 84.57 mL/min (ref >60.00)
Glucose, Bld: 83 mg/dL (ref 70–99)
Potassium: 3.6 meq/L (ref 3.5–5.1)
Sodium: 141 meq/L (ref 135–145)
Total Bilirubin: 0.8 mg/dL (ref 0.2–1.2)
Total Protein: 7.2 g/dL (ref 6.0–8.3)

## 2023-04-09 LAB — LIPID PANEL
Cholesterol: 184 mg/dL (ref 0–200)
HDL: 56.3 mg/dL (ref >39.00)
LDL Cholesterol: 110 mg/dL — ABNORMAL HIGH (ref 0–99)
NonHDL: 127.98
Total CHOL/HDL Ratio: 3
Triglycerides: 92 mg/dL (ref 0.0–149.0)
VLDL: 18.4 mg/dL (ref 0.0–40.0)

## 2023-04-09 LAB — CBC WITH DIFFERENTIAL/PLATELET
Basophils Absolute: 0 10*3/uL (ref 0.0–0.1)
Basophils Relative: 0.7 % (ref 0.0–3.0)
Eosinophils Absolute: 0.1 10*3/uL (ref 0.0–0.7)
Eosinophils Relative: 2.9 % (ref 0.0–5.0)
HCT: 39.3 % (ref 36.0–46.0)
Hemoglobin: 13.1 g/dL (ref 12.0–15.0)
Lymphocytes Relative: 34.4 % (ref 12.0–46.0)
Lymphs Abs: 1.4 10*3/uL (ref 0.7–4.0)
MCHC: 33.5 g/dL (ref 30.0–36.0)
MCV: 89.6 fL (ref 78.0–100.0)
Monocytes Absolute: 0.3 10*3/uL (ref 0.1–1.0)
Monocytes Relative: 7.4 % (ref 3.0–12.0)
Neutro Abs: 2.2 10*3/uL (ref 1.4–7.7)
Neutrophils Relative %: 54.6 % (ref 43.0–77.0)
Platelets: 227 10*3/uL (ref 150.0–400.0)
RBC: 4.39 Mil/uL (ref 3.87–5.11)
RDW: 13.6 % (ref 11.5–15.5)
WBC: 4.1 10*3/uL (ref 4.0–10.5)

## 2023-04-09 LAB — TSH: TSH: 2.42 u[IU]/mL (ref 0.35–5.50)

## 2023-04-09 NOTE — Assessment & Plan Note (Signed)
Recheck lipid panel Adjust Crestor 5 mg daily pending labs  CAC score of 0 last year, good report with cardiology in Nov 2023 Keep up good work with lifestyle

## 2023-04-09 NOTE — Progress Notes (Signed)
Patient ID: Catherine Yates, female    DOB: May 03, 1961, 62 y.o.   MRN: [id number]   Assessment & Plan:  Encounter for annual physical exam -     CBC with Differential/Platelet -     Comprehensive metabolic panel -     Lipid panel -     TSH  Preop examination -     CBC with Differential/Platelet -     Comprehensive metabolic panel -     Lipid panel -     TSH  Mixed hyperlipidemia Assessment & Plan: Recheck lipid panel Adjust Crestor 5 mg daily pending labs  CAC score of 0 last year, good report with cardiology in Nov 2023 Keep up good work with lifestyle  Orders: -     Lipid panel  White coat syndrome with diagnosis of hypertension Assessment & Plan: Elevate BP today - history of WCS Pt currently taking lisinopril-HCTZ 20-12.5 mg daily Monitor at home    Primary osteoarthritis of right hip   Assessment and Plan    Right Hip Osteoarthritis Severe, bone-on-bone arthritis, unresponsive to conservative management including cortisone injections. Scheduled for right hip replacement surgery next week. -Continue current management until surgery.   Preoperative Clearance Patient has no history of diabetes, no recent cardiac symptoms, and no known clotting disorders. Last cardiology consultation was in November of the previous year with no significant findings. -Clear patient for surgery barring any significant findings in today's blood work. -Low risk patient.    General Health Maintenance -Continue regular gynecological care; patient to find a new provider following retirement of previous gynecologist. -Consider bone density test in the next year or two. -Follow-up after surgery.            Return in about 1 year (around 04/08/2024) for physical, fasting labs .    Subjective:    Chief Complaint  Patient presents with   Annual Exam    Pt in office for annual CPE and fasting labs; pt is fasting for past 6 hours; no concerns to discuss. Pre op form needed  to be completed, EKG only if you feel it is necessary; pt states had recent labs and potassium was low and needs to be rechecked as well;     HPI Patient is in today for annual exam. No major medical changes in the last year per patient.   R hip will be replaced next week with Dr. Lequita Halt. Needing labs   Health maintenance: Lifestyle/ exercise: [employer] for exercise, can't play tennis right now due to hip Nutrition: Well-balanced Mental health: Doing well Sleep: Doing well, just cat wakes her up in the mornings  Immunizations: UTD Colonoscopy: Due in 2033  Pap: last done on 06/08/2020, normal Mammogram: UTD DEXA: April 2023 - normal  Skin: No changes - follows with dermatology    Past Medical History:  Diagnosis Date   Allergy    seasonal   Arthritis    Cataract    R eye vitrectemy and then needed cataract   Elevated cholesterol    Encounter for annual physical exam 06/08/2020   Fibroid    Hypertension     Past Surgical History:  Procedure Laterality Date   BREAST BIOPSY Left    approx 2009   CATARACT EXTRACTION Right 10/2021   COLONOSCOPY  2018   ENDOMETRIAL ABLATION     EYE SURGERY Right 08/2020   vitrectemy   WISDOM TOOTH EXTRACTION      Family History  Problem Relation Age of Onset  Breast cancer Mother    Hypertension Mother    Diabetes Mother    Congestive Heart Failure Mother    Renal Disease Mother    Cancer Mother    COPD Mother    Hearing loss Mother    Heart disease Mother        No prior heart attack   Kidney disease Mother    Vision loss Mother    Heart Problems Father    Cancer Father        angiosarcoma   Diabetes Father    Hearing loss Father    Heart disease Father        Pacemaker   Hypertension Father    Hypertension Sister    Hyperlipidemia Sister    Other Sister        breast hyperplasia   Melanoma Sister    Hypertension Sister    Hyperlipidemia Sister    Cancer Sister    Hypertension Sister    Hyperlipidemia Sister     Heart disease Sister        Arrhythmia   Hypertension Brother    Diabetes Brother    Hyperlipidemia Brother    Diabetes Maternal Grandmother    Hearing loss Maternal Grandmother    Stroke Maternal Grandfather    Stomach cancer Paternal Grandfather    Alcoholism Paternal Grandfather    Breast cancer Maternal Aunt    Cancer Maternal Aunt    Colon cancer Neg Hx    Colon polyps Neg Hx    Esophageal cancer Neg Hx    Rectal cancer Neg Hx     Social History   Tobacco Use   Smoking status: Never    Passive exposure: Past (as a child, parents)   Smokeless tobacco: Never  Vaping Use   Vaping status: Never Used  Substance Use Topics   Alcohol use: Yes    Alcohol/week: 2.0 standard drinks of alcohol    Types: 2 Glasses of wine per week    Comment: socially   Drug use: Never     No Known Allergies  Review of Systems NEGATIVE UNLESS OTHERWISE INDICATED IN HPI      Objective:     BP (!) 148/78 (BP Location: Left Arm, Patient Position: Sitting)   Pulse (!) 59   Temp 98.4 F (36.9 C) (Temporal)   Ht 5' 5.75" (1.67 m)   Wt 193 lb 12.8 oz (87.9 kg)   LMP  (LMP Unknown)   SpO2 97%   BMI 31.52 kg/m   Wt Readings from Last 3 Encounters:  04/09/23 193 lb 12.8 oz (87.9 kg)  06/15/22 193 lb (87.5 kg)  03/23/22 195 lb 3.2 oz (88.5 kg)    BP Readings from Last 3 Encounters:  04/09/23 (!) 148/78  06/15/22 124/72  03/23/22 130/88     Physical Exam Vitals and nursing note reviewed.  Constitutional:      Appearance: Normal appearance. She is normal weight. She is not toxic-appearing.  HENT:     Head: Normocephalic and atraumatic.     Right Ear: Tympanic membrane, ear canal and external ear normal.     Left Ear: Tympanic membrane, ear canal and external ear normal.     Nose: Nose normal.     Mouth/Throat:     Mouth: Mucous membranes are moist.  Eyes:     Extraocular Movements: Extraocular movements intact.     Conjunctiva/sclera: Conjunctivae normal.     Pupils:  Pupils are equal, round, and reactive to light.  Cardiovascular:     Rate and Rhythm: Regular rhythm. Bradycardia present.     Pulses: Normal pulses.     Heart sounds: Normal heart sounds.  Pulmonary:     Effort: Pulmonary effort is normal.     Breath sounds: Normal breath sounds.  Abdominal:     General: Abdomen is flat. Bowel sounds are normal.     Palpations: Abdomen is soft.  Musculoskeletal:        General: Normal range of motion.     Cervical back: Normal range of motion and neck supple.     Right lower leg: No edema.     Left lower leg: No edema.  Skin:    General: Skin is warm and dry.  Neurological:     General: No focal deficit present.     Mental Status: She is alert and oriented to person, place, and time.  Psychiatric:        Mood and Affect: Mood normal.        Behavior: Behavior normal.        Thought Content: Thought content normal.        Judgment: Judgment normal.       Annalyse Langlais M Davan Hark, PA-C

## 2023-04-09 NOTE — Assessment & Plan Note (Signed)
Elevate BP today - history of WCS Pt currently taking lisinopril-HCTZ 20-12.5 mg daily Monitor at home

## 2023-04-17 ENCOUNTER — Telehealth: Payer: Self-pay | Admitting: Physician Assistant

## 2023-04-17 ENCOUNTER — Other Ambulatory Visit: Payer: Self-pay

## 2023-04-17 DIAGNOSIS — R7989 Other specified abnormal findings of blood chemistry: Secondary | ICD-10-CM

## 2023-04-17 DIAGNOSIS — M25751 Osteophyte, right hip: Secondary | ICD-10-CM | POA: Diagnosis not present

## 2023-04-17 DIAGNOSIS — M1611 Unilateral primary osteoarthritis, right hip: Secondary | ICD-10-CM | POA: Diagnosis not present

## 2023-04-17 NOTE — Telephone Encounter (Signed)
Lvm requesting pt cb to schedule a lab visit in 6-8 weeks

## 2023-06-13 ENCOUNTER — Other Ambulatory Visit: Payer: BC Managed Care – PPO

## 2023-06-13 ENCOUNTER — Encounter: Payer: BC Managed Care – PPO | Admitting: Physician Assistant

## 2023-06-13 DIAGNOSIS — R7989 Other specified abnormal findings of blood chemistry: Secondary | ICD-10-CM | POA: Diagnosis not present

## 2023-06-13 LAB — HEPATIC FUNCTION PANEL
ALT: 19 U/L (ref 0–35)
AST: 21 U/L (ref 0–37)
Albumin: 4.1 g/dL (ref 3.5–5.2)
Alkaline Phosphatase: 76 U/L (ref 39–117)
Bilirubin, Direct: 0.1 mg/dL (ref 0.0–0.3)
Total Bilirubin: 0.8 mg/dL (ref 0.2–1.2)
Total Protein: 6.7 g/dL (ref 6.0–8.3)

## 2023-06-13 LAB — COMPREHENSIVE METABOLIC PANEL
ALT: 19 U/L (ref 0–35)
AST: 21 U/L (ref 0–37)
Albumin: 4.1 g/dL (ref 3.5–5.2)
Alkaline Phosphatase: 76 U/L (ref 39–117)
BUN: 13 mg/dL (ref 6–23)
CO2: 27 meq/L (ref 19–32)
Calcium: 9.1 mg/dL (ref 8.4–10.5)
Chloride: 103 meq/L (ref 96–112)
Creatinine, Ser: 0.65 mg/dL (ref 0.40–1.20)
GFR: 93.41 mL/min (ref >60.00)
Glucose, Bld: 77 mg/dL (ref 70–99)
Potassium: 3.6 meq/L (ref 3.5–5.1)
Sodium: 139 meq/L (ref 135–145)
Total Bilirubin: 0.8 mg/dL (ref 0.2–1.2)
Total Protein: 6.7 g/dL (ref 6.0–8.3)

## 2023-06-14 ENCOUNTER — Encounter: Payer: Self-pay | Admitting: Physician Assistant

## 2023-06-25 ENCOUNTER — Other Ambulatory Visit: Payer: Self-pay

## 2023-06-25 ENCOUNTER — Ambulatory Visit: Payer: Self-pay | Admitting: Physician Assistant

## 2023-06-25 ENCOUNTER — Ambulatory Visit
Admission: EM | Admit: 2023-06-25 | Discharge: 2023-06-25 | Disposition: A | Discharge status: Home / Self Care | Payer: BC Managed Care – PPO

## 2023-06-25 DIAGNOSIS — S61210A Laceration without foreign body of right index finger without damage to nail, initial encounter: Secondary | ICD-10-CM | POA: Diagnosis not present

## 2023-06-25 NOTE — Telephone Encounter (Signed)
 Noted and agreed, thank you.

## 2023-06-25 NOTE — Discharge Instructions (Addendum)
 Advised patient to allow Dermabond to come off on its own over the next 3 to 4 days.  Advised patient to keep area dry and clean for the next 24 to 36 hours afterwards patient can get this area wet.  Advised if symptoms worsen and/or unresolved please follow-up with PCP or here for further evaluation.

## 2023-06-25 NOTE — ED Provider Notes (Signed)
 [facility]    CSN: [id number] Arrival date & time: 06/25/23  0934      History   Chief Complaint Chief Complaint  Patient presents with   Laceration    RT index finger    HPI Catherine Yates is a 63 y.o. female.   HPI 63 year old female presents with right index finger laceration.  PMH significant for obesity, HTN, and mixed HLD.  Past Medical History:  Diagnosis Date   Allergy    seasonal   Arthritis    Cataract    R eye vitrectemy and then needed cataract   Elevated cholesterol    Encounter for annual physical exam 06/08/2020   Fibroid    Hypertension     Patient Active Problem List   Diagnosis Date Noted   Mixed hyperlipidemia 03/20/2022   White coat syndrome with diagnosis of hypertension 03/20/2022   Osteoarthritis of right hip 09/28/2021   Pain in joint of right hip 09/12/2021   Rosacea 06/12/2021   Encounter for annual physical exam 06/08/2020   Family history of colonic polyps 06/08/2020   Obesity 06/08/2020    Past Surgical History:  Procedure Laterality Date   BREAST BIOPSY Left    approx 2009   CATARACT EXTRACTION Right 10/2021   COLONOSCOPY  2018   ENDOMETRIAL ABLATION     EYE SURGERY Right 08/2020   vitrectemy   WISDOM TOOTH EXTRACTION      OB History     Gravida  0   Para  0   Term  0   Preterm  0   AB  0   Living  0      SAB  0   IAB  0   Ectopic  0   Multiple  0   Live Births  0            Home Medications    Prior to Admission medications   Medication Sig Start Date End Date Taking? Authorizing Provider  Azelaic Acid 15 % cream Apply to affected areas daily. 03/05/14   [provider]  lisinopril-hydrochlorothiazide (ZESTORETIC) 20-12.5 MG tablet TAKE 1 TABLET BY MOUTH EVERY DAY 04/08/23   Allwardt, Alyssa M, PA-C  Potassium Chloride ER 20 MEQ TBCR Take 2 tablets by mouth daily. 04/03/23   [provider]  rosuvastatin (CRESTOR) 5 MG tablet TAKE 1 TABLET (5 MG TOTAL) BY  MOUTH DAILY. 08/15/22   Allwardt, Crist Infante, PA-C  tretinoin (RETIN-A) 0.025 % cream Apply 1 Application topically at bedtime. 05/24/17   [provider]    Family History Family History  Problem Relation Age of Onset   Breast cancer Mother    Hypertension Mother    Diabetes Mother    Congestive Heart Failure Mother    Renal Disease Mother    Cancer Mother    COPD Mother    Hearing loss Mother    Heart disease Mother        No prior heart attack   Kidney disease Mother    Vision loss Mother    Heart Problems Father    Cancer Father        angiosarcoma   Diabetes Father    Hearing loss Father    Heart disease Father        Pacemaker   Hypertension Father    Hypertension Sister    Hyperlipidemia Sister    Other Sister        breast hyperplasia   Melanoma Sister  Hypertension Sister    Hyperlipidemia Sister    Cancer Sister    Hypertension Sister    Hyperlipidemia Sister    Heart disease Sister        Arrhythmia   Hypertension Brother    Diabetes Brother    Hyperlipidemia Brother    Diabetes Maternal Grandmother    Hearing loss Maternal Grandmother    Stroke Maternal Grandfather    Stomach cancer Paternal Grandfather    Alcoholism Paternal Grandfather    Breast cancer Maternal Aunt    Cancer Maternal Aunt    Colon cancer Neg Hx    Colon polyps Neg Hx    Esophageal cancer Neg Hx    Rectal cancer Neg Hx     Social History Social History   Tobacco Use   Smoking status: Never    Passive exposure: Past (as a child, parents)   Smokeless tobacco: Never  Vaping Use   Vaping status: Never Used  Substance Use Topics   Alcohol use: Yes    Alcohol/week: 2.0 standard drinks of alcohol    Types: 2 Glasses of wine per week    Comment: socially   Drug use: Never     Allergies   Patient has no known allergies.   Review of Systems Review of Systems  Skin:  Positive for wound.  All other systems reviewed and are negative.    Physical Exam Triage  Vital Signs ED Triage Vitals  Encounter Vitals Group     BP 06/25/23 0946 (!) 164/86     Systolic BP Percentile --      Diastolic BP Percentile --      Pulse Rate 06/25/23 0946 72     Resp --      Temp 06/25/23 0946 98.4 F (36.9 C)     Temp Source 06/25/23 0946 Oral     SpO2 06/25/23 0946 98 %     Weight --      Height --      Head Circumference --      Peak Flow --      Pain Score 06/25/23 0948 0     Pain Loc --      Pain Education --      Exclude from Growth Chart --    No data found.  Updated Vital Signs BP (!) 164/86 (BP Location: Left Arm)   Pulse 72   Temp 98.4 F (36.9 C) (Oral)   LMP  (LMP Unknown)   SpO2 98%     Physical Exam Vitals and nursing note reviewed.  Constitutional:      General: She is not in acute distress.    Appearance: Normal appearance. She is obese. She is not ill-appearing.  HENT:     Head: Normocephalic and atraumatic.     Mouth/Throat:     Mouth: Mucous membranes are moist.     Pharynx: Oropharynx is clear.  Eyes:     Extraocular Movements: Extraocular movements intact.     Conjunctiva/sclera: Conjunctivae normal.     Pupils: Pupils are equal, round, and reactive to light.  Cardiovascular:     Rate and Rhythm: Normal rate and regular rhythm.     Pulses: Normal pulses.     Heart sounds: Normal heart sounds.  Pulmonary:     Effort: Pulmonary effort is normal.     Breath sounds: Normal breath sounds. No wheezing, rhonchi or rales.  Musculoskeletal:        General: Normal range of motion.  Skin:  General: Skin is warm and dry.     Comments: Right index finger (volar aspect of DIP adjacent to nail plate):~0.5 cm nonbleeding laceration noted please see image below  Neurological:     General: No focal deficit present.     Mental Status: She is alert and oriented to person, place, and time. Mental status is at baseline.  Psychiatric:        Mood and Affect: Mood normal.        Behavior: Behavior normal.      UC Treatments /  Results  Labs (all labs ordered are listed, but only abnormal results are displayed) Labs Reviewed - No data to display  EKG   Radiology No results found.  Procedures Procedures (including critical care time)  Medications Ordered in UC Medications - No data to display  Initial Impression / Assessment and Plan / UC Course  I have reviewed the triage vital signs and the nursing notes.  Pertinent labs & imaging results that were available during my care of the patient were reviewed by me and considered in my medical decision making (see chart for details).     MDM: 1.  Laceration of right index finger without foreign body, nail damage status unspecified, initial encounter-Dermabond used to approximate wound borders.  Patient tolerated procedure well. Advised patient to allow Dermabond to come off on its own over the next 3 to 4 days.  Advised patient to keep area dry and clean for the next 24 to 36 hours afterwards patient can get this area wet.  Advised if symptoms worsen and/or unresolved please follow-up with PCP or here for further evaluation.  Final Clinical Impressions(s) / UC Diagnoses   Final diagnoses:  Laceration of right index finger without foreign body, nail damage status unspecified, initial encounter     Discharge Instructions      Advised patient to allow Dermabond to come off on its own over the next 3 to 4 days.  Advised patient to keep area dry and clean for the next 24 to 36 hours afterwards patient can get this area wet.  Advised if symptoms worsen and/or unresolved please follow-up with PCP or here for further evaluation.     ED Prescriptions   None    PDMP not reviewed this encounter.   Trevor Iha, FNP 06/25/23 1040

## 2023-06-25 NOTE — Telephone Encounter (Signed)
 FYI pt sent to [facility]

## 2023-06-25 NOTE — Telephone Encounter (Signed)
  Chief Complaint: right index finger laceration Symptoms: laceration to right index finger with bleeding Frequency: occurred last night Pertinent Negatives: Patient denies pus drainage. Disposition: [] ED /[x] Urgent Care (no appt availability in office) / [] Appointment(In office/virtual)/ []  [facility]/ [] Home Care/ [] Refused Recommended Disposition /[]  Mobile Bus/ []  Follow-up with PCP Additional Notes: Patient states she has been placing band aids to her finger and has been adding bandages on top but has had a hard time holding direct pressure. Patient states she cut her right index finger last night on a canned lid in the recycling bin. She thinks her last tetanus booster is over 31 years old. Patient states she is in the parking lot. Called [facility] and they recommend urgent care.  Copied from CRM [id number]. Topic: Clinical - Red Word Triage >> Jun 25, 2023  8:55 AM Almira Coaster wrote: Red Word that prompted transfer to Nurse Triage: Patient cut the tip of her right index finger and it will not stop bleeding. Reason for Disposition  [1] Bleeding AND [2] won't stop after 10 minutes of direct pressure (using correct technique)  Answer Assessment - Initial Assessment Questions 1. APPEARANCE of INJURY: "What does the injury look like?"      Clean cut, denies any pus.  2. SIZE: "How large is the cut?"      Less than half an inch.  3. BLEEDING: "Is it bleeding now?" If Yes, ask: "Is it difficult to stop?"      She states it was gushing blood and applied bandages. She states she is having a difficult time stopping the blood. She states she has a band aid on it now and is adding more bandages on it.  4. LOCATION: "Where is the injury located?"      Right hand index finger, near the fingernail.  5. ONSET: "How long ago did the injury occur?"      Last night.  6. MECHANISM: "Tell me how it happened."      Patient states she was pushing something into the recycling and her finger  jammed on a can lid.  7. TETANUS: "When was the last tetanus booster?"     More than 5 years.  Protocols used: Cuts and Lacerations-A-AH

## 2023-06-25 NOTE — ED Triage Notes (Addendum)
 Pt c/o laceration to RT index finger that occurred at 5p last night. Was cut with a can lid in her recycling  can. Last tdap 06/12/21

## 2023-07-05 ENCOUNTER — Other Ambulatory Visit: Payer: Self-pay | Admitting: Physician Assistant

## 2023-07-05 ENCOUNTER — Other Ambulatory Visit: Payer: Self-pay | Admitting: Podiatry

## 2023-09-11 ENCOUNTER — Other Ambulatory Visit: Payer: Self-pay | Admitting: Podiatry

## 2023-09-29 ENCOUNTER — Other Ambulatory Visit: Payer: Self-pay | Admitting: Physician Assistant

## 2023-10-06 ENCOUNTER — Other Ambulatory Visit: Payer: Self-pay | Admitting: Podiatry

## 2023-10-15 ENCOUNTER — Other Ambulatory Visit: Payer: Self-pay

## 2023-10-15 ENCOUNTER — Telehealth: Payer: Self-pay

## 2023-10-15 MED ORDER — ROSUVASTATIN CALCIUM 5 MG PO TABS: 5.0000 mg | ORAL_TABLET | Freq: Every day | ORAL | 1 refills | Status: DC

## 2023-10-15 NOTE — Telephone Encounter (Signed)
 Copied from CRM [id number]. Topic: Clinical - Medication Question >> Oct 15, 2023  8:40 AM Emmet Harm C wrote: Reason for CRM: Patient went to transfer her rosuvastatin  (CRESTOR ) 5 MG tablet  to [facility] on [address] [place] [address] and was told there are no refills so she just wants to verify why  Returned pt call and advised refill of medication sent to new pharmacy [facility] [address]; sent 90 day supply with 1 refill. Pt aware due for OV in December. Nothing further needed at this time.

## 2023-10-31 ENCOUNTER — Other Ambulatory Visit: Payer: Self-pay | Admitting: Podiatry

## 2023-11-26 ENCOUNTER — Other Ambulatory Visit: Payer: Self-pay | Admitting: Podiatry

## 2023-12-28 ENCOUNTER — Other Ambulatory Visit: Payer: Self-pay | Admitting: Physician Assistant

## 2024-02-05 DIAGNOSIS — D485 Neoplasm of uncertain behavior of skin: Secondary | ICD-10-CM | POA: Diagnosis not present

## 2024-02-05 DIAGNOSIS — L814 Other melanin hyperpigmentation: Secondary | ICD-10-CM | POA: Diagnosis not present

## 2024-02-05 DIAGNOSIS — L821 Other seborrheic keratosis: Secondary | ICD-10-CM | POA: Diagnosis not present

## 2024-02-05 DIAGNOSIS — L578 Other skin changes due to chronic exposure to nonionizing radiation: Secondary | ICD-10-CM | POA: Diagnosis not present

## 2024-02-05 DIAGNOSIS — D225 Melanocytic nevi of trunk: Secondary | ICD-10-CM | POA: Diagnosis not present

## 2024-03-16 DIAGNOSIS — Z1231 Encounter for screening mammogram for malignant neoplasm of breast: Secondary | ICD-10-CM | POA: Diagnosis not present

## 2024-03-16 DIAGNOSIS — Z7689 Persons encountering health services in other specified circumstances: Secondary | ICD-10-CM | POA: Diagnosis not present

## 2024-03-16 DIAGNOSIS — Z1159 Encounter for screening for other viral diseases: Secondary | ICD-10-CM | POA: Diagnosis not present

## 2024-03-16 DIAGNOSIS — Z114 Encounter for screening for human immunodeficiency virus [HIV]: Secondary | ICD-10-CM | POA: Diagnosis not present

## 2024-03-16 DIAGNOSIS — Z131 Encounter for screening for diabetes mellitus: Secondary | ICD-10-CM | POA: Diagnosis not present

## 2024-03-16 DIAGNOSIS — I10 Essential (primary) hypertension: Secondary | ICD-10-CM | POA: Diagnosis not present

## 2024-03-16 DIAGNOSIS — E785 Hyperlipidemia, unspecified: Secondary | ICD-10-CM | POA: Diagnosis not present

## 2024-03-16 DIAGNOSIS — Z23 Encounter for immunization: Secondary | ICD-10-CM | POA: Diagnosis not present

## 2024-03-16 DIAGNOSIS — Z1329 Encounter for screening for other suspected endocrine disorder: Secondary | ICD-10-CM | POA: Diagnosis not present

## 2024-03-27 DIAGNOSIS — Z780 Asymptomatic menopausal state: Secondary | ICD-10-CM | POA: Diagnosis not present

## 2024-03-27 DIAGNOSIS — Z1231 Encounter for screening mammogram for malignant neoplasm of breast: Secondary | ICD-10-CM | POA: Diagnosis not present

## 2024-03-27 DIAGNOSIS — R92323 Mammographic fibroglandular density, bilateral breasts: Secondary | ICD-10-CM | POA: Diagnosis not present

## 2024-04-09 ENCOUNTER — Encounter: Payer: BC Managed Care – PPO | Admitting: Physician Assistant

## 2024-04-13 DIAGNOSIS — I10 Essential (primary) hypertension: Secondary | ICD-10-CM | POA: Diagnosis not present

## 2024-04-16 DIAGNOSIS — Z780 Asymptomatic menopausal state: Secondary | ICD-10-CM | POA: Diagnosis not present

## 2024-04-16 DIAGNOSIS — Z01419 Encounter for gynecological examination (general) (routine) without abnormal findings: Secondary | ICD-10-CM | POA: Diagnosis not present

## 2024-04-16 DIAGNOSIS — N952 Postmenopausal atrophic vaginitis: Secondary | ICD-10-CM | POA: Diagnosis not present

## 2024-04-24 DIAGNOSIS — Z96641 Presence of right artificial hip joint: Secondary | ICD-10-CM | POA: Diagnosis not present

## 2024-05-15 ENCOUNTER — Other Ambulatory Visit: Payer: Self-pay | Admitting: Physician Assistant
